# Patient Record
Sex: Male | Born: 1975 | Race: White | Hispanic: No | Marital: Married | Smoking: Never smoker
Health system: Southern US, Community
[De-identification: ages and names within clinical notes are randomized; demographics above are authoritative.]

## PROBLEM LIST (undated history)

## (undated) DIAGNOSIS — R7303 Prediabetes: Secondary | ICD-10-CM

## (undated) DIAGNOSIS — N200 Calculus of kidney: Secondary | ICD-10-CM

## (undated) DIAGNOSIS — K649 Unspecified hemorrhoids: Secondary | ICD-10-CM

## (undated) DIAGNOSIS — K219 Gastro-esophageal reflux disease without esophagitis: Secondary | ICD-10-CM

## (undated) DIAGNOSIS — I10 Essential (primary) hypertension: Secondary | ICD-10-CM

## (undated) DIAGNOSIS — E785 Hyperlipidemia, unspecified: Secondary | ICD-10-CM

## (undated) HISTORY — PX: CHOLECYSTECTOMY: SHX55

---

## 2004-07-13 ENCOUNTER — Emergency Department: Payer: Self-pay | Admitting: General Practice

## 2007-01-08 ENCOUNTER — Inpatient Hospital Stay: Payer: Self-pay | Admitting: Surgery

## 2007-01-08 ENCOUNTER — Other Ambulatory Visit: Payer: Self-pay

## 2008-02-10 ENCOUNTER — Emergency Department: Payer: Self-pay | Admitting: Emergency Medicine

## 2008-08-31 ENCOUNTER — Emergency Department: Payer: Self-pay | Admitting: Emergency Medicine

## 2010-04-17 ENCOUNTER — Emergency Department: Payer: Self-pay | Admitting: Emergency Medicine

## 2010-09-10 DIAGNOSIS — N201 Calculus of ureter: Secondary | ICD-10-CM

## 2010-09-10 HISTORY — DX: Calculus of ureter: N20.1

## 2010-09-10 HISTORY — PX: LITHOTRIPSY: SUR834

## 2011-05-14 ENCOUNTER — Emergency Department: Payer: Self-pay | Admitting: Emergency Medicine

## 2011-06-18 ENCOUNTER — Ambulatory Visit (HOSPITAL_COMMUNITY)
Admission: RE | Admit: 2011-06-18 | Discharge: 2011-06-18 | Disposition: A | Discharge status: Home / Self Care | Payer: BC Managed Care – PPO | Source: Ambulatory Visit | Attending: Urology | Admitting: Urology

## 2011-06-18 DIAGNOSIS — N201 Calculus of ureter: Secondary | ICD-10-CM | POA: Insufficient documentation

## 2012-07-13 ENCOUNTER — Emergency Department: Payer: Self-pay | Admitting: Internal Medicine

## 2012-07-13 LAB — BASIC METABOLIC PANEL
BUN: 13 mg/dL (ref 7–18)
Creatinine: 0.84 mg/dL (ref 0.60–1.30)
EGFR (African American): >60
EGFR (Non-African Amer.): >60
Potassium: 3.8 mmol/L (ref 3.5–5.1)
Sodium: 138 mmol/L (ref 136–145)

## 2012-07-13 LAB — CBC
HGB: 15.6 g/dL (ref 13.0–18.0)
MCH: 31.5 pg (ref 26.0–34.0)
RBC: 4.97 10*6/uL (ref 4.40–5.90)
WBC: 9.9 10*3/uL (ref 3.8–10.6)

## 2013-06-01 ENCOUNTER — Ambulatory Visit: Payer: Self-pay | Admitting: Urology

## 2013-07-24 ENCOUNTER — Ambulatory Visit: Payer: Self-pay | Admitting: Physician Assistant

## 2013-08-11 ENCOUNTER — Emergency Department: Payer: Self-pay | Admitting: Emergency Medicine

## 2013-08-11 LAB — COMPREHENSIVE METABOLIC PANEL
Albumin: 4.2 g/dL (ref 3.4–5.0)
Alkaline Phosphatase: 106 U/L
Anion Gap: 7 (ref 7–16)
BUN: 13 mg/dL (ref 7–18)
Calcium, Total: 9.3 mg/dL (ref 8.5–10.1)
Chloride: 99 mmol/L (ref 98–107)
Co2: 28 mmol/L (ref 21–32)
EGFR (African American): >60
EGFR (Non-African Amer.): >60
Osmolality: 269 (ref 275–301)
SGPT (ALT): 41 U/L (ref 12–78)
Sodium: 134 mmol/L — ABNORMAL LOW (ref 136–145)

## 2013-08-11 LAB — CBC
HCT: 43.4 % (ref 40.0–52.0)
MCHC: 34.2 g/dL (ref 32.0–36.0)
RBC: 4.92 10*6/uL (ref 4.40–5.90)

## 2013-08-11 LAB — URINALYSIS, COMPLETE
Nitrite: NEGATIVE
Ph: 6 (ref 4.5–8.0)
RBC,UR: 5653 /HPF (ref 0–5)
Specific Gravity: 1.024 (ref 1.003–1.030)
WBC UR: 83 /HPF (ref 0–5)

## 2013-10-14 ENCOUNTER — Ambulatory Visit: Payer: Self-pay | Admitting: Urology

## 2014-08-16 IMAGING — CR DG ABDOMEN 1V
1 series · 2 of 2 positions shown · non-contrast
Comparison: CT of 07/24/2013.

CLINICAL DATA: Right flank pain.  Hematuria for 2 days.

EXAM:
ABDOMEN - 1 VIEW

[Series 1: t abdomen supine · 0.14mm/px · 2 of 2 slices shown]
[im 1/2]
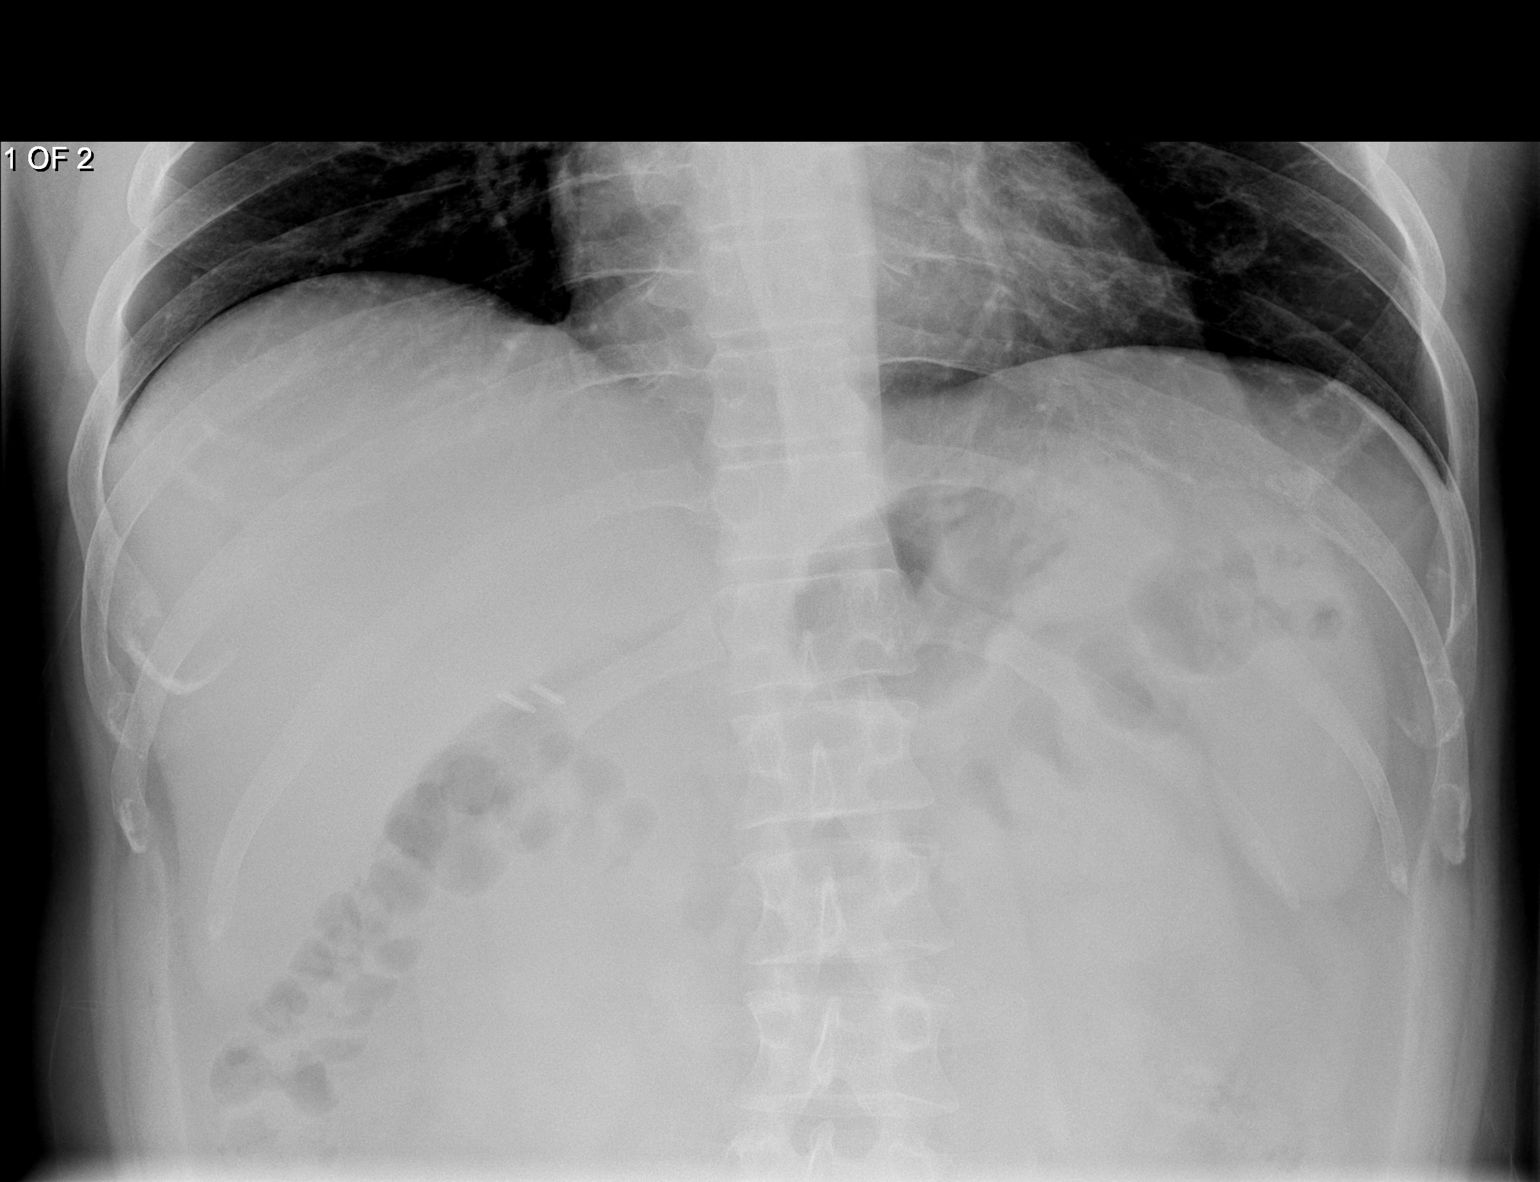
[im 2/2]
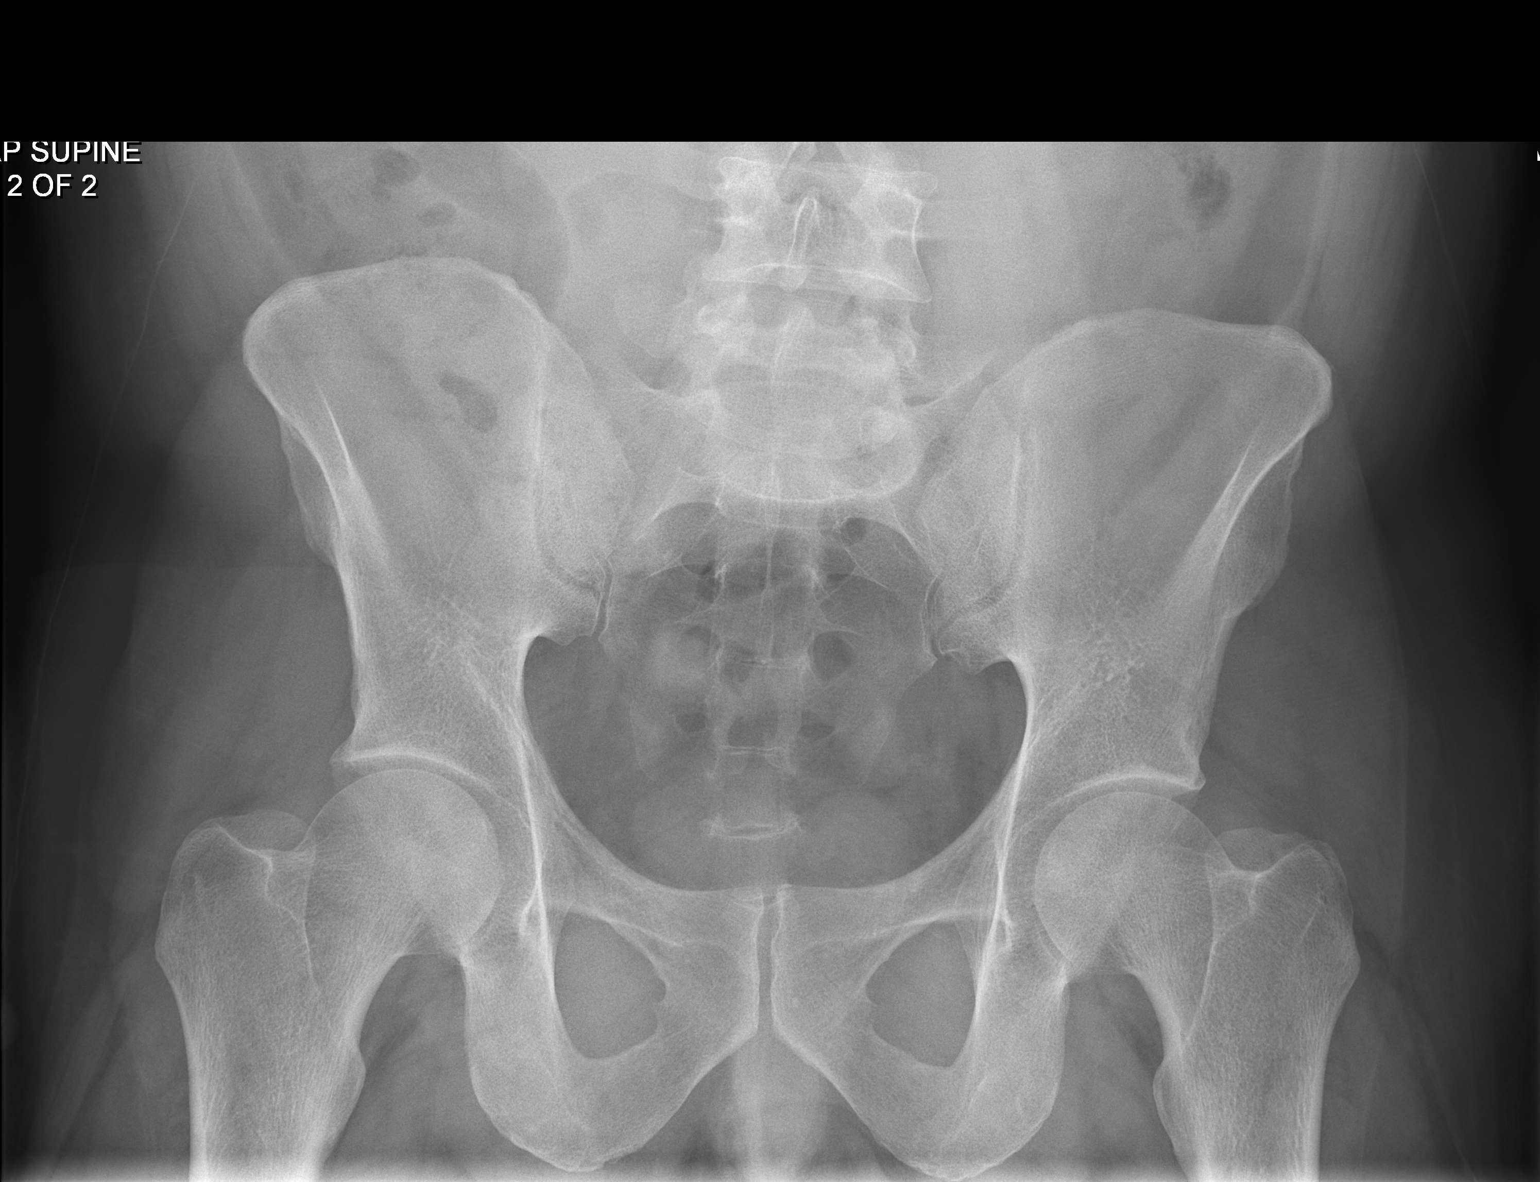

[2 of 2 positions shown; findings below may reference images not displayed]

FINDINGS: Two supine views. Cholecystectomy. The tiny right renal and right
proximal ureteric calculi are not plain film evident. No new stones
are identified. Non-obstructive bowel gas pattern.
IMPRESSION: No plain film evidence of urinary tract calculi.

## 2021-05-15 ENCOUNTER — Emergency Department
Admission: EM | Admit: 2021-05-15 | Discharge: 2021-05-15 | Disposition: A | Discharge status: Home / Self Care | Payer: BC Managed Care – PPO | Attending: Student in an Organized Health Care Education/Training Program | Admitting: Student in an Organized Health Care Education/Training Program

## 2021-05-15 ENCOUNTER — Other Ambulatory Visit: Payer: Self-pay

## 2021-05-15 DIAGNOSIS — A09 Infectious gastroenteritis and colitis, unspecified: Secondary | ICD-10-CM

## 2021-05-15 DIAGNOSIS — Z20822 Contact with and (suspected) exposure to covid-19: Secondary | ICD-10-CM | POA: Diagnosis not present

## 2021-05-15 DIAGNOSIS — R112 Nausea with vomiting, unspecified: Secondary | ICD-10-CM | POA: Diagnosis not present

## 2021-05-15 DIAGNOSIS — R6883 Chills (without fever): Secondary | ICD-10-CM | POA: Diagnosis not present

## 2021-05-15 DIAGNOSIS — R197 Diarrhea, unspecified: Secondary | ICD-10-CM | POA: Insufficient documentation

## 2021-05-15 LAB — C DIFFICILE QUICK SCREEN W PCR REFLEX
C Diff antigen: NEGATIVE
C Diff interpretation: NOT DETECTED
C Diff toxin: NEGATIVE

## 2021-05-15 LAB — GASTROINTESTINAL PANEL BY PCR, STOOL (REPLACES STOOL CULTURE)
Adenovirus F40/41: NOT DETECTED
Astrovirus: NOT DETECTED
Campylobacter species: NOT DETECTED
Cryptosporidium: NOT DETECTED
Cyclospora cayetanensis: NOT DETECTED
Entamoeba histolytica: NOT DETECTED
Enteroaggregative E coli (EAEC): DETECTED — AB
Enteropathogenic E coli (EPEC): DETECTED — AB
Enterotoxigenic E coli (ETEC): NOT DETECTED
Giardia lamblia: NOT DETECTED
Norovirus GI/GII: DETECTED — AB
Plesimonas shigelloides: NOT DETECTED
Rotavirus A: NOT DETECTED
Salmonella species: NOT DETECTED
Sapovirus (I, II, IV, and V): NOT DETECTED
Shiga like toxin producing E coli (STEC): NOT DETECTED
Shigella/Enteroinvasive E coli (EIEC): NOT DETECTED
Vibrio cholerae: NOT DETECTED
Vibrio species: NOT DETECTED
Yersinia enterocolitica: NOT DETECTED

## 2021-05-15 LAB — CBC
HCT: 42 % (ref 39.0–52.0)
Hemoglobin: 15 g/dL (ref 13.0–17.0)
MCH: 31.5 pg (ref 26.0–34.0)
MCHC: 35.7 g/dL (ref 30.0–36.0)
MCV: 88.2 fL (ref 80.0–100.0)
Platelets: 321 10*3/uL (ref 150–400)
RBC: 4.76 MIL/uL (ref 4.22–5.81)
RDW: 12.9 % (ref 11.5–15.5)
WBC: 11.9 10*3/uL — ABNORMAL HIGH (ref 4.0–10.5)
nRBC: 0 % (ref 0.0–0.2)

## 2021-05-15 LAB — COMPREHENSIVE METABOLIC PANEL
ALT: 34 U/L (ref 0–44)
AST: 26 U/L (ref 15–41)
Albumin: 4 g/dL (ref 3.5–5.0)
Alkaline Phosphatase: 84 U/L (ref 38–126)
Anion gap: 8 (ref 5–15)
BUN: 10 mg/dL (ref 6–20)
CO2: 23 mmol/L (ref 22–32)
Calcium: 8.8 mg/dL — ABNORMAL LOW (ref 8.9–10.3)
Chloride: 104 mmol/L (ref 98–111)
Creatinine, Ser: 0.64 mg/dL (ref 0.61–1.24)
GFR, Estimated: >60 mL/min (ref >60)
Glucose, Bld: 159 mg/dL — ABNORMAL HIGH (ref 70–99)
Potassium: 3.4 mmol/L — ABNORMAL LOW (ref 3.5–5.1)
Sodium: 135 mmol/L (ref 135–145)
Total Bilirubin: 0.9 mg/dL (ref 0.3–1.2)
Total Protein: 7.5 g/dL (ref 6.5–8.1)

## 2021-05-15 LAB — URINALYSIS, COMPLETE (UACMP) WITH MICROSCOPIC
Bacteria, UA: NONE SEEN
Bilirubin Urine: NEGATIVE
Glucose, UA: NEGATIVE mg/dL
Hgb urine dipstick: NEGATIVE
Ketones, ur: NEGATIVE mg/dL
Nitrite: NEGATIVE
Specific Gravity, Urine: 1.02 (ref 1.005–1.030)
Squamous Epithelial / HPF: NONE SEEN (ref 0–5)
pH: 5.5 (ref 5.0–8.0)

## 2021-05-15 LAB — RESP PANEL BY RT-PCR (FLU A&B, COVID) ARPGX2
Influenza A by PCR: NEGATIVE
Influenza B by PCR: NEGATIVE
SARS Coronavirus 2 by RT PCR: NEGATIVE

## 2021-05-15 LAB — LIPASE, BLOOD: Lipase: 29 U/L (ref 11–51)

## 2021-05-15 MED ORDER — ONDANSETRON 4 MG PO TBDP
4.0000 mg | ORAL_TABLET | Freq: Once | ORAL | Status: AC
  Administered 2021-05-15: 4 mg via ORAL
  Filled 2021-05-15: qty 1

## 2021-05-15 MED ORDER — ONDANSETRON 4 MG PO TBDP: 4.0000 mg | ORAL_TABLET | Freq: Three times a day (TID) | ORAL | 0 refills | Status: DC | PRN

## 2021-05-15 NOTE — ED Provider Notes (Signed)
[facility] Emergency Department Provider Note    Event Date/Time   First MD Initiated Contact with Patient 05/15/21 1605     (approximate)  I have reviewed the triage vital signs and the nursing notes.   HISTORY  Chief Complaint Emesis    HPI Samuel Villanueva is a 45 y.o. male here with his son for nausea vomiting diarrhea for the past 24 hours after recently returning from the [place]  His wife who traveled with them were sick with similar illness 24 hours prior symptoms resolved.  He denies any blood in his stool.  States it is watery and greenish tinged.  Is having some chills.  They have not been having any cough congestion.  Denies any abdominal pain at this time but was having some crampy discomfort related diarrhea.  History reviewed. No pertinent past medical history. No family history on file. History reviewed. No pertinent surgical history. There are no problems to display for this patient.     Prior to Admission medications   Medication Sig Start Date End Date Taking? Authorizing Provider  ondansetron (ZOFRAN ODT) 4 MG disintegrating tablet Take 1 tablet (4 mg total) by mouth every 8 (eight) hours as needed for nausea or vomiting. 05/15/21  Yes Willy Eddy, MD    Allergies Penicillin g    Social History    Review of Systems Patient denies headaches, rhinorrhea, blurry vision, numbness, shortness of breath, chest pain, edema, cough, abdominal pain, nausea, vomiting, diarrhea, dysuria, fevers, rashes or hallucinations unless otherwise stated above in HPI. ____________________________________________   PHYSICAL EXAM:  VITAL SIGNS: Vitals:   05/15/21 1554 05/15/21 1754  BP: (!) 150/92 (!) 139/93  Pulse: (!) 108 87  Resp: 20 18  Temp: 98.1 F (36.7 C)   SpO2: 96% 99%    Constitutional: Alert and oriented.  Eyes: Conjunctivae are normal.  Head: Atraumatic. Nose: No congestion/rhinnorhea. Mouth/Throat: Mucous  membranes are moist.   Neck: No stridor. Painless ROM.  Cardiovascular: Normal rate, regular rhythm. Grossly normal heart sounds.  Good peripheral circulation. Respiratory: Normal respiratory effort.  No retractions. Lungs CTAB. Gastrointestinal: Soft and nontender. No distention. No abdominal bruits. No CVA tenderness. Genitourinary:  Musculoskeletal: No lower extremity tenderness nor edema.  No joint effusions. Neurologic:  Normal speech and language. No gross focal neurologic deficits are appreciated. No facial droop Skin:  Skin is warm, dry and intact. No rash noted. Psychiatric: Mood and affect are normal. Speech and behavior are normal.  ____________________________________________   LABS (all labs ordered are listed, but only abnormal results are displayed)  Results for orders placed or performed during the hospital encounter of 05/15/21 (from the past 24 hour(s))  Lipase, blood     Status: None   Collection Time: 05/15/21  3:57 PM  Result Value Ref Range   Lipase 29 11 - 51 U/L  Comprehensive metabolic panel     Status: Abnormal   Collection Time: 05/15/21  3:57 PM  Result Value Ref Range   Sodium 135 135 - 145 mmol/L   Potassium 3.4 (L) 3.5 - 5.1 mmol/L   Chloride 104 98 - 111 mmol/L   CO2 23 22 - 32 mmol/L   Glucose, Bld 159 (H) 70 - 99 mg/dL   BUN 10 6 - 20 mg/dL   Creatinine, Ser 1.74 0.61 - 1.24 mg/dL   Calcium 8.8 (L) 8.9 - 10.3 mg/dL   Total Protein 7.5 6.5 - 8.1 g/dL   Albumin 4.0 3.5 - 5.0 g/dL  AST 26 15 - 41 U/L   ALT 34 0 - 44 U/L   Alkaline Phosphatase 84 38 - 126 U/L   Total Bilirubin 0.9 0.3 - 1.2 mg/dL   GFR, Estimated >76 >72 mL/min   Anion gap 8 5 - 15  CBC     Status: Abnormal   Collection Time: 05/15/21  3:57 PM  Result Value Ref Range   WBC 11.9 (H) 4.0 - 10.5 K/uL   RBC 4.76 4.22 - 5.81 MIL/uL   Hemoglobin 15.0 13.0 - 17.0 g/dL   HCT 09.4 70.9 - 62.8 %   MCV 88.2 80.0 - 100.0 fL   MCH 31.5 26.0 - 34.0 pg   MCHC 35.7 30.0 - 36.0 g/dL    RDW 36.6 29.4 - 76.5 %   Platelets 321 150 - 400 K/uL   nRBC 0.0 0.0 - 0.2 %  Urinalysis, Complete w Microscopic     Status: Abnormal   Collection Time: 05/15/21  4:11 PM  Result Value Ref Range   Color, Urine YELLOW YELLOW   APPearance CLEAR CLEAR   Specific Gravity, Urine 1.020 1.005 - 1.030   pH 5.5 5.0 - 8.0   Glucose, UA NEGATIVE NEGATIVE mg/dL   Hgb urine dipstick NEGATIVE NEGATIVE   Bilirubin Urine NEGATIVE NEGATIVE   Ketones, ur NEGATIVE NEGATIVE mg/dL   Protein, ur TRACE (A) NEGATIVE mg/dL   Nitrite NEGATIVE NEGATIVE   Leukocytes,Ua TRACE (A) NEGATIVE   Squamous Epithelial / LPF NONE SEEN 0 - 5   WBC, UA 6-10 0 - 5 WBC/hpf   RBC / HPF 0-5 0 - 5 RBC/hpf   Bacteria, UA NONE SEEN NONE SEEN   Mucus PRESENT   Gastrointestinal Panel by PCR , Stool     Status: Abnormal   Collection Time: 05/15/21  4:24 PM   Specimen: Stool  Result Value Ref Range   Campylobacter species NOT DETECTED NOT DETECTED   Plesimonas shigelloides NOT DETECTED NOT DETECTED   Salmonella species NOT DETECTED NOT DETECTED   Yersinia enterocolitica NOT DETECTED NOT DETECTED   Vibrio species NOT DETECTED NOT DETECTED   Vibrio cholerae NOT DETECTED NOT DETECTED   Enteroaggregative E coli (EAEC) DETECTED (A) NOT DETECTED   Enteropathogenic E coli (EPEC) DETECTED (A) NOT DETECTED   Enterotoxigenic E coli (ETEC) NOT DETECTED NOT DETECTED   Shiga like toxin producing E coli (STEC) NOT DETECTED NOT DETECTED   Shigella/Enteroinvasive E coli (EIEC) NOT DETECTED NOT DETECTED   Cryptosporidium NOT DETECTED NOT DETECTED   Cyclospora cayetanensis NOT DETECTED NOT DETECTED   Entamoeba histolytica NOT DETECTED NOT DETECTED   Giardia lamblia NOT DETECTED NOT DETECTED   Adenovirus F40/41 NOT DETECTED NOT DETECTED   Astrovirus NOT DETECTED NOT DETECTED   Norovirus GI/GII DETECTED (A) NOT DETECTED   Rotavirus A NOT DETECTED NOT DETECTED   Sapovirus (I, II, IV, and V) NOT DETECTED NOT DETECTED  C Difficile Quick Screen  w PCR reflex     Status: None   Collection Time: 05/15/21  4:24 PM   Specimen: Stool  Result Value Ref Range   C Diff antigen NEGATIVE NEGATIVE   C Diff toxin NEGATIVE NEGATIVE   C Diff interpretation No C. difficile detected.   Resp Panel by RT-PCR (Flu A&B, Covid) Nasopharyngeal Swab     Status: None   Collection Time: 05/15/21  4:31 PM   Specimen: Nasopharyngeal Swab; Nasopharyngeal(NP) swabs in vial transport medium  Result Value Ref Range   SARS Coronavirus 2 by RT PCR NEGATIVE  NEGATIVE   Influenza A by PCR NEGATIVE NEGATIVE   Influenza B by PCR NEGATIVE NEGATIVE   ____________________________________________  EKG____________________________________________  RADIOLOGY   ____________________________________________   PROCEDURES  Procedure(s) performed:  Procedures    Critical Care performed: no ____________________________________________   INITIAL IMPRESSION / ASSESSMENT AND PLAN / ED COURSE  Pertinent labs & imaging results that were available during my care of the patient were reviewed by me and considered in my medical decision making (see chart for details).   DDX: enteritis, td, c-dif, fbi, electrolyte abn, covid, dehydration  Samuel Villanueva is a 45 y.o. who presents to the ED with traveler's diarrhea presentation as described above.  He is well nontoxic.  Given Zofran with improvement in symptoms.  His abdominal exam soft and benign.  We will send off stool studies.  Is COVID-negative.  C. difficile negative.  GI PCR positive for norovirus as well as E. coli.  He remains well perfused.  Symptoms improved after Zofran.  Feels comfortable with outpatient follow-up.  We discussed strict return precautions. Patient agreeable to plan.     The patient was evaluated in Emergency Department today for the symptoms described in the history of present illness. He/she was evaluated in the context of the global COVID-19 pandemic, which necessitated consideration that the  patient might be at risk for infection with the SARS-CoV-2 virus that causes COVID-19. Institutional protocols and algorithms that pertain to the evaluation of patients at risk for COVID-19 are in a state of rapid change based on information released by regulatory bodies including the CDC and federal and state organizations. These policies and algorithms were followed during the patient's care in the ED.  As part of my medical decision making, I reviewed the following data within the electronic MEDICAL RECORD NUMBER Nursing notes reviewed and incorporated, Labs reviewed, notes from prior ED visits and [place] Controlled Substance Database   ____________________________________________   FINAL CLINICAL IMPRESSION(S) / ED DIAGNOSES  Final diagnoses:  Traveler's diarrhea      NEW MEDICATIONS STARTED DURING THIS VISIT:  New Prescriptions   ONDANSETRON (ZOFRAN ODT) 4 MG DISINTEGRATING TABLET    Take 1 tablet (4 mg total) by mouth every 8 (eight) hours as needed for nausea or vomiting.     Note:  This document was prepared using Dragon voice recognition software and may include unintentional dictation errors.    Willy Eddy, MD 05/15/21 [phone]

## 2021-05-15 NOTE — ED Triage Notes (Signed)
Pt to ED for diarrhea and emesis that started today. Just returned from [place] Wife and son with same sx. Denies pain

## 2021-05-19 ENCOUNTER — Emergency Department
Admission: EM | Admit: 2021-05-19 | Discharge: 2021-05-19 | Disposition: A | Discharge status: Home / Self Care | Payer: BC Managed Care – PPO | Attending: Emergency Medicine | Admitting: Emergency Medicine

## 2021-05-19 ENCOUNTER — Other Ambulatory Visit: Payer: Self-pay

## 2021-05-19 DIAGNOSIS — K644 Residual hemorrhoidal skin tags: Secondary | ICD-10-CM | POA: Insufficient documentation

## 2021-05-19 DIAGNOSIS — K6289 Other specified diseases of anus and rectum: Secondary | ICD-10-CM | POA: Diagnosis present

## 2021-05-19 LAB — CBC
HCT: 41.2 % (ref 39.0–52.0)
Hemoglobin: 14.1 g/dL (ref 13.0–17.0)
MCH: 30.1 pg (ref 26.0–34.0)
MCHC: 34.2 g/dL (ref 30.0–36.0)
MCV: 88 fL (ref 80.0–100.0)
Platelets: 336 10*3/uL (ref 150–400)
RBC: 4.68 MIL/uL (ref 4.22–5.81)
RDW: 12.9 % (ref 11.5–15.5)
WBC: 10.4 10*3/uL (ref 4.0–10.5)
nRBC: 0 % (ref 0.0–0.2)

## 2021-05-19 MED ORDER — SENNOSIDES-DOCUSATE SODIUM 8.6-50 MG PO TABS: 2.0000 | ORAL_TABLET | Freq: Two times a day (BID) | ORAL | 0 refills | Status: DC

## 2021-05-19 MED ORDER — WITCH HAZEL-GLYCERIN EX PADS
1.0000 | MEDICATED_PAD | CUTANEOUS | 12 refills | Status: DC | PRN
Start: 2021-05-19 — End: 2022-01-16

## 2021-05-19 NOTE — ED Notes (Signed)
ED Provider at bedside. 

## 2021-05-19 NOTE — ED Provider Notes (Signed)
[facility] Emergency Department Provider Note  ____________________________________________  Time seen: Approximately 10:15 PM  I have reviewed the triage vital signs and the nursing notes.   HISTORY  Chief Complaint Rectal Bleeding    HPI Samuel Villanueva is a 45 y.o. male who comes ED complaining of rectal bleeding since this afternoon.  Reports having some rectal bleeding a few days ago, seeking care and being told that he had a hemorrhoid.  He has been trying to put Preparation H on it.  He denies any dizziness chest pain shortness of breath or passing out.  He does not take any blood thinners.  It was constant, no aggravating or alleviating factors.  Not taking any stool softeners.    Past medical history noncontributory   There are no problems to display for this patient.    Past surgical history noncontributory   Prior to Admission medications   Medication Sig Start Date End Date Taking? Authorizing Provider  senna-docusate (SENOKOT-S) 8.6-50 MG tablet Take 2 tablets by mouth 2 (two) times daily. 05/19/21  Yes Sharman Cheek, MD  witch hazel-glycerin (TUCKS) pad Apply 1 application topically as needed for itching. 05/19/21  Yes Sharman Cheek, MD  ondansetron (ZOFRAN ODT) 4 MG disintegrating tablet Take 1 tablet (4 mg total) by mouth every 8 (eight) hours as needed for nausea or vomiting. 05/15/21   Willy Eddy, MD     Allergies Penicillin g   No family history on file.  Social History    Review of Systems  Constitutional:   No fever or chills.  ENT:   No sore throat. No rhinorrhea. Cardiovascular:   No chest pain or syncope. Respiratory:   No dyspnea or cough. Gastrointestinal:   Negative for abdominal pain, vomiting and diarrhea.  Musculoskeletal:   Negative for focal pain or swelling All other systems reviewed and are negative except as documented above in ROS and  HPI.  ____________________________________________   PHYSICAL EXAM:  VITAL SIGNS: ED Triage Vitals  Enc Vitals Group     BP 05/19/21 1920 (!) 136/96     Pulse Rate 05/19/21 1920 94     Resp 05/19/21 1920 16     Temp 05/19/21 1920 98.4 F (36.9 C)     Temp Source 05/19/21 1920 Oral     SpO2 05/19/21 1920 100 %     Weight 05/19/21 1921 220 lb (99.8 kg)     Height 05/19/21 1921 5\' 8"  (1.727 m)     Head Circumference --      Peak Flow --      Pain Score 05/19/21 1921 0     Pain Loc --      Pain Edu? --      Excl. in GC? --     Vital signs reviewed, nursing assessments reviewed.   Constitutional:   Alert and oriented. Non-toxic appearance. Eyes:   Conjunctivae are normal. EOMI. PERRL. ENT      Head:   Normocephalic and atraumatic.      Nose:   Wearing a mask.      Mouth/Throat:   Wearing a mask.      Neck:   No meningismus. Full ROM. Hematological/Lymphatic/Immunilogical:   No cervical lymphadenopathy. Cardiovascular:   RRR.Cap refill less than 2 seconds. Respiratory:   Normal respiratory effort without tachypnea/retractions. Gastrointestinal:   Soft and nontender. Non distended.  No rebound, rigidity, or guarding.  Anal exam shows engorged external hemorrhoid, ulcerated with evidence of recent bleeding.  No current bleeding.  Hemorrhoid surface has thrombosed.  Not significantly tender or inflamed.  Musculoskeletal:   Normal range of motion in all extremities. No joint effusions.  No lower extremity tenderness.  No edema. Neurologic:   Normal speech and language.  Motor grossly intact. No acute focal neurologic deficits are appreciated.  Skin:    Skin is warm, dry and intact. No rash noted.  No petechiae, purpura, or bullae.  ____________________________________________    LABS (pertinent positives/negatives) (all labs ordered are listed, but only abnormal results are displayed) Labs Reviewed  CBC  POC OCCULT BLOOD, ED    ____________________________________________   EKG    ____________________________________________    RADIOLOGY  No results found.  ____________________________________________   PROCEDURES Procedures  ____________________________________________    CLINICAL IMPRESSION / ASSESSMENT AND PLAN / ED COURSE  Medications ordered in the ED: Medications - No data to display  Pertinent labs & imaging results that were available during my care of the patient were reviewed by me and considered in my medical decision making (see chart for details).  Samuel Villanueva was evaluated in Emergency Department on 05/19/2021 for the symptoms described in the history of present illness. He was evaluated in the context of the global COVID-19 pandemic, which necessitated consideration that the patient might be at risk for infection with the SARS-CoV-2 virus that causes COVID-19. Institutional protocols and algorithms that pertain to the evaluation of patients at risk for COVID-19 are in a state of rapid change based on information released by regulatory bodies including the CDC and federal and state organizations. These policies and algorithms were followed during the patient's care in the ED.   Patient presents with a bleeding external hemorrhoid.  Currently bleeding has resolved.  Vital signs, symptoms, hemoglobin are all okay.  He will continue Preparation H and sitz bath's at home.  Also add on Senokot and Tucks which hazel pads.  Recommend he follow-up with general surgery, likely requiring banding or excision.      ____________________________________________   FINAL CLINICAL IMPRESSION(S) / ED DIAGNOSES    Final diagnoses:  External hemorrhoid, bleeding     ED Discharge Orders          Ordered    senna-docusate (SENOKOT-S) 8.6-50 MG tablet  2 times daily        05/19/21 2215    witch hazel-glycerin (TUCKS) pad  As needed        05/19/21 2215            Portions of  this note were generated with dragon dictation software. Dictation errors may occur despite best attempts at proofreading.    Sharman Cheek, MD 05/19/21 2219

## 2021-05-19 NOTE — ED Triage Notes (Signed)
Pt states he was diagnosed with e coli intestinal infection this week. Pt states he now has bright red rectal bleeding. Pt states he was told he has a hemmorrhoid. Pt states has been bleeding since this morning.

## 2021-05-19 NOTE — ED Notes (Addendum)
ED Provider at bedside. 

## 2021-12-27 ENCOUNTER — Ambulatory Visit: Payer: BC Managed Care – PPO | Admitting: Urology

## 2021-12-27 ENCOUNTER — Other Ambulatory Visit
Admission: RE | Admit: 2021-12-27 | Discharge: 2021-12-27 | Disposition: A | Discharge status: Home / Self Care | Payer: BC Managed Care – PPO | Attending: Urology | Admitting: Urology

## 2021-12-27 ENCOUNTER — Encounter: Payer: Self-pay | Admitting: Urology

## 2021-12-27 ENCOUNTER — Other Ambulatory Visit: Payer: Self-pay | Admitting: *Deleted

## 2021-12-27 VITALS — BP 144/92 | HR 74 | Ht 68.0 in | Wt 235.0 lb

## 2021-12-27 DIAGNOSIS — N200 Calculus of kidney: Secondary | ICD-10-CM

## 2021-12-27 DIAGNOSIS — R310 Gross hematuria: Secondary | ICD-10-CM | POA: Insufficient documentation

## 2021-12-27 DIAGNOSIS — Z87442 Personal history of urinary calculi: Secondary | ICD-10-CM | POA: Diagnosis not present

## 2021-12-27 LAB — URINALYSIS, COMPLETE (UACMP) WITH MICROSCOPIC
Bacteria, UA: NONE SEEN
Bilirubin Urine: NEGATIVE
Glucose, UA: NEGATIVE mg/dL
Ketones, ur: NEGATIVE mg/dL
Leukocytes,Ua: NEGATIVE
Nitrite: NEGATIVE
Protein, ur: 100 mg/dL — AB
Specific Gravity, Urine: >1.03 — ABNORMAL HIGH (ref 1.005–1.030)
WBC, UA: NONE SEEN WBC/hpf (ref 0–5)
pH: 7 (ref 5.0–8.0)

## 2021-12-27 NOTE — Patient Instructions (Signed)

## 2021-12-27 NOTE — Progress Notes (Signed)
? ?  12/27/21 ?9:45 AM  ? ?Sandra Cockayne ?Feb 12, 1976 ?[id number] ? ?CC: Gross hematuria, history nephrolithiasis ? ?HPI: ?46 year old healthy male referred for gross hematuria.  He reports 2 to 3 days of dark brown and red urine that was asymptomatic about 2 to 3 weeks ago.  Urinalysis at that time showed many bacteria, greater than 50 RBCs, 0 WBCs negative leukocyte Estrace, nitrite positive, but urine culture was ultimately negative.  He denies any pain or dysuria at that time.  He was treated with Flomax and Cipro, and hematuria resolved in the next few days.  He denies any problems over the last 2 weeks, and is voiding yellow urine spontaneously without issues. ? ?He has a history of nephrolithiasis with 1 spontaneously passed stone, and 1 shockwave lithotripsy. ? ?He denies any smoking history ? ?Urinalysis today is pending ? ?Social History:  reports that he has never smoked. He has never been exposed to tobacco smoke. He has never used smokeless tobacco. He reports that he does not currently use alcohol. He reports that he does not use drugs. ? ?Physical Exam: ?BP (!) 144/92   Pulse 74   Ht 5\' 8"  (1.727 m)   Wt 235 lb (106.6 kg)   BMI 35.73 kg/m?   ? ?Constitutional:  Alert and oriented, No acute distress. ?Cardiovascular: No clubbing, cyanosis, or edema. ?Respiratory: Normal respiratory effort, no increased work of breathing. ?GI: Abdomen is soft, nontender, nondistended, no abdominal masses ? ? ?Laboratory Data: ?Reviewed ? ?Assessment & Plan:   ?46 year old male with 2 to 3 days of dark brown urine and suspected gross hematuria of unclear etiology, urinalysis showed greater than 50 RBCs with many bacteria and nitrite positive, however 0 WBCs and leukocyte negative, and culture showed no growth.  Denies any hematuria or urinary symptoms or flank pain over the last 2 weeks. ? ?We discussed common possible etiologies of hematuria including BPH, malignancy, urolithiasis, medical renal disease, and  idiopathic. Standard workup recommended by the [facility] includes imaging with CT urogram to assess the upper tracts, and cystoscopy. Cytology is performed on patient's with gross hematuria to look for malignant cells in the urine. ? ?CT urogram ordered ?Follow-up urinalysis today, if persistent microscopic hematuria likely pursue cystoscopy as well ? ? ?61, MD ?12/27/2021 ? ?[facility] ?[address] ?[place], [place] ?[phone] ? ? ?

## 2021-12-29 ENCOUNTER — Telehealth: Payer: Self-pay

## 2021-12-29 NOTE — Telephone Encounter (Signed)
-----   Message from Sondra Come, MD sent at 12/28/2021  1:58 PM EDT ----- ?Please schedule cystoscopy for persistent microscopic blood in the urine ? ?Legrand Rams, MD ?12/28/2021 ? ? ?

## 2021-12-29 NOTE — Telephone Encounter (Signed)
Duplicate encounter. See previous. 

## 2021-12-29 NOTE — Telephone Encounter (Signed)
-----   Message from Sondra Come, MD sent at 12/27/2021 10:31 AM EDT ----- ?Regarding: UA results ?Urinalysis today does show microscopic hematuria, so I would recommend cystoscopy as we discussed in clinic.  Please schedule cystoscopy and will review CT results at that visit ? ?Legrand Rams, MD ?12/27/2021 ? ? ? ?

## 2021-12-29 NOTE — Telephone Encounter (Signed)
Called pt no answer. Left detailed message per DPR. Cysto scheduled. CT scan previously ordered.  ?

## 2022-01-09 ENCOUNTER — Ambulatory Visit
Admission: RE | Admit: 2022-01-09 | Discharge: 2022-01-09 | Disposition: A | Discharge status: Home / Self Care | Payer: BC Managed Care – PPO | Source: Ambulatory Visit | Attending: Urology | Admitting: Urology

## 2022-01-09 ENCOUNTER — Ambulatory Visit: Admission: RE | Admit: 2022-01-09 | Payer: BC Managed Care – PPO | Source: Ambulatory Visit

## 2022-01-09 DIAGNOSIS — R310 Gross hematuria: Secondary | ICD-10-CM | POA: Insufficient documentation

## 2022-01-09 MED ORDER — IOHEXOL 350 MG/ML SOLN
100.0000 mL | Freq: Once | INTRAVENOUS | Status: AC | PRN
  Administered 2022-01-09: 100 mL via INTRAVENOUS

## 2022-01-16 ENCOUNTER — Ambulatory Visit: Payer: BC Managed Care – PPO | Admitting: Urology

## 2022-01-16 ENCOUNTER — Other Ambulatory Visit
Admission: RE | Admit: 2022-01-16 | Discharge: 2022-01-16 | Disposition: A | Discharge status: Home / Self Care | Payer: BC Managed Care – PPO | Attending: Urology | Admitting: Urology

## 2022-01-16 ENCOUNTER — Encounter: Payer: Self-pay | Admitting: Urology

## 2022-01-16 ENCOUNTER — Other Ambulatory Visit: Payer: Self-pay | Admitting: *Deleted

## 2022-01-16 VITALS — Ht 68.0 in | Wt 235.0 lb

## 2022-01-16 DIAGNOSIS — N200 Calculus of kidney: Secondary | ICD-10-CM | POA: Diagnosis not present

## 2022-01-16 DIAGNOSIS — R310 Gross hematuria: Secondary | ICD-10-CM | POA: Diagnosis present

## 2022-01-16 DIAGNOSIS — R3121 Asymptomatic microscopic hematuria: Secondary | ICD-10-CM

## 2022-01-16 LAB — URINALYSIS, COMPLETE (UACMP) WITH MICROSCOPIC
Bilirubin Urine: NEGATIVE
Glucose, UA: NEGATIVE mg/dL
Ketones, ur: NEGATIVE mg/dL
Leukocytes,Ua: NEGATIVE
Nitrite: NEGATIVE
Protein, ur: 100 mg/dL — AB
Specific Gravity, Urine: >1.03 — ABNORMAL HIGH (ref 1.005–1.030)
pH: 6.5 (ref 5.0–8.0)

## 2022-01-16 MED ORDER — SULFAMETHOXAZOLE-TRIMETHOPRIM 800-160 MG PO TABS
1.0000 | ORAL_TABLET | Freq: Once | ORAL | Status: AC
  Administered 2022-01-16: 1 via ORAL

## 2022-01-16 NOTE — Progress Notes (Signed)
Cystoscopy Procedure Note: ? ?Indication: Microscopic hematuria ? ?46 year old male who originally presented with a few days of dark brown urine and urinalysis was suspicious for infection, however culture was negative.  This completely resolved with a course of Flomax and Cipro from PCP.  Urinalysis at our clinic visit showed persistent microscopic hematuria and he opted for further evaluation with CT and cystoscopy. ? ?Bactrim given for prophylaxis ? ?After informed consent and discussion of the procedure and its risks, Samuel Villanueva was positioned and prepped in the standard fashion. Cystoscopy was performed with a flexible cystoscope. The urethra, bladder neck and entire bladder was visualized in a standard fashion. The prostate was moderate in size. The ureteral orifices were visualized in their normal location and orientation.  Bladder mucosa grossly normal throughout, mild erythema at the bladder neck on retroflexion but no suspicious lesions. ? ?Imaging: ?CT with 1 cm right lower pole nonobstructive stone, otherwise benign ? ?Findings: ?Normal cystoscopy ? ?------------------------------------------------------------------------------------------- ? ?Assessment and Plan: ?46 year old male with possible gross hematuria that was likely related to prostatitis and resolved with antibiotics and Flomax.  He had persistent microscopic hematuria at follow-up that warranted work-up with CT and cystoscopy which are benign aside from a right-sided 1 cm nonobstructive lower pole stone.  We discussed options including surveillance with KUB, shockwave lithotripsy, or ureteroscopy, laser lithotripsy, and stent placement.  Risks and benefits discussed at length.  He has undergone shockwave before.  He would like to start with surveillance, but understands return precautions of right-sided flank pain, recurrent UTIs, or recurrent gross hematuria. ? ?RTC 6 months KUB prior, sooner if problems ? ?Legrand Rams, MD ?01/16/2022   ? ?

## 2022-07-24 ENCOUNTER — Ambulatory Visit: Payer: BC Managed Care – PPO | Admitting: Urology

## 2022-07-31 ENCOUNTER — Ambulatory Visit: Payer: BC Managed Care – PPO | Admitting: Urology

## 2022-10-15 ENCOUNTER — Encounter: Payer: Self-pay | Admitting: Emergency Medicine

## 2022-10-15 ENCOUNTER — Other Ambulatory Visit: Payer: Self-pay

## 2022-10-15 ENCOUNTER — Emergency Department
Admission: EM | Admit: 2022-10-15 | Discharge: 2022-10-15 | Disposition: A | Discharge status: Home / Self Care | Payer: BC Managed Care – PPO | Attending: Emergency Medicine | Admitting: Emergency Medicine

## 2022-10-15 ENCOUNTER — Emergency Department: Payer: BC Managed Care – PPO

## 2022-10-15 DIAGNOSIS — Z1152 Encounter for screening for COVID-19: Secondary | ICD-10-CM | POA: Diagnosis not present

## 2022-10-15 DIAGNOSIS — J069 Acute upper respiratory infection, unspecified: Secondary | ICD-10-CM | POA: Insufficient documentation

## 2022-10-15 DIAGNOSIS — J101 Influenza due to other identified influenza virus with other respiratory manifestations: Secondary | ICD-10-CM | POA: Diagnosis not present

## 2022-10-15 DIAGNOSIS — R059 Cough, unspecified: Secondary | ICD-10-CM | POA: Diagnosis present

## 2022-10-15 LAB — RESP PANEL BY RT-PCR (RSV, FLU A&B, COVID)  RVPGX2
Influenza A by PCR: POSITIVE — AB
Influenza B by PCR: NEGATIVE
Resp Syncytial Virus by PCR: NEGATIVE
SARS Coronavirus 2 by RT PCR: NEGATIVE

## 2022-10-15 NOTE — Discharge Instructions (Signed)
Your swab is positive for influenza A.  Please follow-up with your outpatient provider.  Please return for any new, worsening, or changing symptoms or other concerns.  You may continue to take Tylenol/ibuprofen per package instructions to help with your symptoms.

## 2022-10-15 NOTE — ED Provider Notes (Signed)
[facility] Provider Note    Event Date/Time   First MD Initiated Contact with Patient 10/15/22 1211     (approximate)   History   URI   HPI  Samuel Villanueva is a 47 y.o. male who presents today for evaluation of cough and runny nose.  Patient reports that his son is sick with the same symptoms and also presents emergency department for evaluation.  Patient reports that he is coughing up a lot of yellow sputum.  He denies fevers or chills.  He has not had any chest pain, shortness of breath, abdominal pain, nausea, vomiting, or diarrhea.  He has not taken anything for his symptoms.  There are no problems to display for this patient.         Physical Exam   Triage Vital Signs: ED Triage Vitals [10/15/22 1214]  Enc Vitals Group     BP      Pulse      Resp      Temp 98.8 F (37.1 C)     Temp Source Oral     SpO2      Weight 235 lb 0.2 oz (106.6 kg)     Height 5\' 8"  (1.727 m)     Head Circumference      Peak Flow      Pain Score 0     Pain Loc      Pain Edu?      Excl. in [place]?     Most recent vital signs: Vitals:   10/15/22 1217 10/15/22 1341  BP: 124/86 120/80  Pulse: 88 80  Resp: 18 18  Temp:    SpO2: 100% 99%    Physical Exam Vitals and nursing note reviewed.  Constitutional:      General: Awake and alert. No acute distress.    Appearance: Normal appearance. The patient is obese.  HENT:     Head: Normocephalic and atraumatic.     Mouth: Mucous membranes are moist.  Nasal congestion present Eyes:     General: PERRL. Normal EOMs        Right eye: No discharge.        Left eye: No discharge.     Conjunctiva/sclera: Conjunctivae normal.  Cardiovascular:     Rate and Rhythm: Normal rate and regular rhythm.     Pulses: Normal pulses.  Pulmonary:     Effort: Pulmonary effort is normal. No respiratory distress.  Able to speak easily in complete sentences    Breath sounds: Normal breath sounds.  Abdominal:     Abdomen is  soft. There is no abdominal tenderness. No rebound or guarding. No distention. Musculoskeletal:        General: No swelling. Normal range of motion.     Cervical back: Normal range of motion and neck supple.  Skin:    General: Skin is warm and dry.     Capillary Refill: Capillary refill takes less than 2 seconds.     Findings: No rash.  Neurological:     Mental Status: The patient is awake and alert.      ED Results / Procedures / Treatments   Labs (all labs ordered are listed, but only abnormal results are displayed) Labs Reviewed  RESP PANEL BY RT-PCR (RSV, FLU A&B, COVID)  RVPGX2 - Abnormal; Notable for the following components:      Result Value   Influenza A by PCR POSITIVE (*)    All other components within normal limits  EKG     RADIOLOGY I independently reviewed and interpreted imaging and agree with radiologists findings.     PROCEDURES:  Critical Care performed:   Procedures   MEDICATIONS ORDERED IN ED: Medications - No data to display   IMPRESSION / MDM / [place] / ED COURSE  I reviewed the triage vital signs and the nursing notes.   Differential diagnosis includes, but is not limited to, COVID, pneumonia, influenza, bronchitis, other URI.  Patient is awake and alert, hemodynamically stable and afebrile.  He has a normal oxygen saturation of 100% on room air.  He is nontoxic in appearance.  His lungs are clear to auscultation bilaterally.  He is quite concerned about his sputum production and would like an x-ray to evaluate for pneumonia.  X-ray was negative for pneumonia.  Swab is positive for influenza A.  Discussed this result with the patient and his family.  Advised that he is highly contagious to others.  Discussed symptomatic management and return precautions. Also discussed option for tamiflu, declined.  Patient understands and agrees with plan.  Discharged in stable condition.   Patient's presentation is most consistent with  acute complicated illness / injury requiring diagnostic workup.      FINAL CLINICAL IMPRESSION(S) / ED DIAGNOSES   Final diagnoses:  Influenza A     Rx / DC Orders   ED Discharge Orders     None        Note:  This document was prepared using Dragon voice recognition software and may include unintentional dictation errors.   Emeline Gins 10/15/22 1345    Harvest Dark, MD 10/15/22 1356

## 2022-10-15 NOTE — ED Triage Notes (Signed)
Presents with cough and body aches  Sx's started last weeks  Afebrile on arrival

## 2022-11-15 ENCOUNTER — Other Ambulatory Visit: Payer: Self-pay | Admitting: Physician Assistant

## 2022-11-15 DIAGNOSIS — R109 Unspecified abdominal pain: Secondary | ICD-10-CM

## 2022-11-19 ENCOUNTER — Telehealth: Payer: Self-pay

## 2022-11-19 ENCOUNTER — Ambulatory Visit
Admission: RE | Admit: 2022-11-19 | Discharge: 2022-11-19 | Disposition: A | Discharge status: Home / Self Care | Payer: BC Managed Care – PPO | Source: Intra-hospital | Attending: Physician Assistant | Admitting: Physician Assistant

## 2022-11-19 ENCOUNTER — Ambulatory Visit: Payer: BC Managed Care – PPO | Admitting: Physician Assistant

## 2022-11-19 ENCOUNTER — Other Ambulatory Visit: Payer: Self-pay | Admitting: Urology

## 2022-11-19 ENCOUNTER — Ambulatory Visit
Admission: RE | Admit: 2022-11-19 | Discharge: 2022-11-19 | Disposition: A | Discharge status: Home / Self Care | Payer: BC Managed Care – PPO | Source: Ambulatory Visit | Attending: Physician Assistant | Admitting: Physician Assistant

## 2022-11-19 VITALS — BP 144/90 | HR 74 | Ht 68.0 in | Wt 232.0 lb

## 2022-11-19 DIAGNOSIS — R109 Unspecified abdominal pain: Secondary | ICD-10-CM | POA: Insufficient documentation

## 2022-11-19 DIAGNOSIS — Z87442 Personal history of urinary calculi: Secondary | ICD-10-CM | POA: Insufficient documentation

## 2022-11-19 DIAGNOSIS — N200 Calculus of kidney: Secondary | ICD-10-CM | POA: Diagnosis not present

## 2022-11-19 LAB — MICROSCOPIC EXAMINATION

## 2022-11-19 LAB — URINALYSIS, COMPLETE
Bilirubin, UA: NEGATIVE
Glucose, UA: NEGATIVE
Ketones, UA: NEGATIVE
Nitrite, UA: NEGATIVE
Specific Gravity, UA: 1.025 (ref 1.005–1.030)
Urobilinogen, Ur: 0.2 mg/dL (ref 0.2–1.0)
pH, UA: 6 (ref 5.0–7.5)

## 2022-11-19 MED ORDER — SULFAMETHOXAZOLE-TRIMETHOPRIM 800-160 MG PO TABS: 1.0000 | ORAL_TABLET | Freq: Two times a day (BID) | ORAL | 0 refills | Status: AC

## 2022-11-19 NOTE — Progress Notes (Signed)
Surgical Physician Order Form [facility]  * Scheduling expectation : Next Available  *Length of Case: 1 hour  *Clearance needed: no  *Anticoagulation Instructions: May continue all anticoagulants  *Aspirin Instructions: Ok to continue Aspirin  *Post-op visit Date/Instructions:   tbd  *Diagnosis: Right nephrolithiasis  *Procedure: right Ureteroscopy w/laser lithotripsy & stent placement [id number])   Additional orders: N/A  -Admit type: OUTpatient  -Anesthesia: General  -VTE Prophylaxis Standing Order SCD's       Other:   -Standing Lab Orders Per Anesthesia    Lab other: UA&Urine Culture sent 3/11  -Standing Test orders EKG/Chest x-ray per Anesthesia       Test other:   - Medications:  Ancef 2gm IV  -Other orders:  N/A

## 2022-11-19 NOTE — Telephone Encounter (Signed)
I spoke with Samuel Villanueva while in office. We have discussed possible surgery dates and Friday March 15th, 2024 was agreed upon by all parties. Patient given information about surgery date, what to expect pre-operatively and post operatively.  We discussed that a Pre-Admission Testing office will be calling to set up the pre-op visit that will take place prior to surgery, and that these appointments are typically done over the phone with a Pre-Admissions RN. Informed patient that our office will communicate any additional care to be provided after surgery. Patients questions or concerns were discussed during our call. Advised to call our office should there be any additional information, questions or concerns that arise. Patient verbalized understanding.

## 2022-11-19 NOTE — Progress Notes (Signed)
   [facility]  Surgery Date: Date: 11/23/2022  Surgeon: Dr. Nickolas Madrid, MD  Inpt ( No  )   Outpt (Yes)   Obs ( No  )   Diagnosis: N20.0 Right Nephrolithiasis  -CPT: [phone]  Surgery: Right Ureteroscopy with Laser Lithotripsy and Stent Placement  Stop Anticoagulations: No, may continue all  Cardiac/Medical/Pulmonary Clearance needed: no  *Orders entered into EPIC  Date: 11/19/22   *Case booked in EPIC  Date: 11/19/22  *Notified pt of Surgery: Date: 11/19/22  PRE-OP UA & CX: yes, obtained while in clinic today 11/19/2022  *Placed into Prior Authorization Work Fabio Bering Date: 11/19/22  Assistant/laser/rep:No

## 2022-11-19 NOTE — Progress Notes (Unsigned)
11/19/2022 1:33 PM   Samuel Villanueva 1976-08-03 [id number]  CC: Chief Complaint  Patient presents with   Nephrolithiasis   Follow-up    HPI: Samuel Villanueva is a 47 y.o. male with PMH gross hematuria with benign workup in 2023 who presents today for ED follow-up of an acute stone episode.  He was seen at [facility] clinic on 10/24/2022 with reports of gross hematuria 2 days prior and right-sided back pain.  UA was notable for microscopic hematuria, no nitrites, pyuria, or bacteriuria.  A KUB was performed, which was suspicious for a 12 mm proximal right ureteral stone at the level of L3.  Today he reports he had persistent right flank pain and vomiting that suddenly resolved 4 days ago.  Today he feels well with no acute concerns.  He denies fever, chills, nausea, vomiting, and dysuria.  He does report some mild RUQ discomfort, but admits he recently had the flu and thinks this may be some rib cage discomfort in the setting of cough.  KUB today shows a large, 15 mm stone apparently in the right renal pelvis.  On review of prior CT, stone measures up to 1100 HU and density.  Skin to stone distance approximately 14 cm.  In-office UA today positive for trace intact blood, 2+ protein, and trace leukocytes; urine microscopy with 11-30 WBCs/HPF, 3-10 RBCs/HPF, and moderate bacteria.   PMH: No past medical history on file.  Surgical History: No past surgical history on file.  Home Medications:  Allergies as of 11/19/2022       Reactions   Chlorpheniramine Maleate Rash   Hydrochlorothiazide Rash   Penicillin G Other (See Comments), Rash        Medication List        Accurate as of November 19, 2022  1:33 PM. If you have any questions, ask your nurse or doctor.          omeprazole 40 MG capsule Commonly known as: PRILOSEC Take 40 mg by mouth daily.        Allergies:  Allergies  Allergen Reactions   Chlorpheniramine Maleate Rash   Hydrochlorothiazide Rash    Penicillin G Other (See Comments) and Rash    Family History: No family history on file.  Social History:   reports that he has never smoked. He has never been exposed to tobacco smoke. He has never used smokeless tobacco. He reports that he does not currently use alcohol. He reports that he does not use drugs.  Physical Exam: BP (!) 144/90   Pulse 74   Ht '5\' 8"'$  (1.727 m)   Wt 232 lb (105.2 kg)   BMI 35.28 kg/m   Constitutional:  Alert and oriented, no acute distress, nontoxic appearing HEENT: [place], AT Cardiovascular: No clubbing, cyanosis, or edema Respiratory: Normal respiratory effort, no increased work of breathing Skin: No rashes, bruises or suspicious lesions Neurologic: Grossly intact, no focal deficits, moving all 4 extremities Psychiatric: Normal mood and affect  Laboratory Data: Results for orders placed or performed in visit on 11/19/22  Microscopic Examination   Urine  Result Value Ref Range   WBC, UA 11-30 (A) 0 - 5 /hpf   RBC, Urine 3-10 (A) 0 - 2 /hpf   Epithelial Cells (non renal) 0-10 0 - 10 /hpf   Mucus, UA Present (A) Not Estab.   Bacteria, UA Moderate (A) None seen/Few  Urinalysis, Complete  Result Value Ref Range   Specific Gravity, UA 1.025 1.005 - 1.030  pH, UA 6.0 5.0 - 7.5   Color, UA Yellow Yellow   Appearance Ur Hazy (A) Clear   Leukocytes,UA Trace (A) Negative   Protein,UA 2+ (A) Negative/Trace   Glucose, UA Negative Negative   Ketones, UA Negative Negative   RBC, UA Trace (A) Negative   Bilirubin, UA Negative Negative   Urobilinogen, Ur 0.2 0.2 - 1.0 mg/dL   Nitrite, UA Negative Negative   Microscopic Examination See below:    Pertinent Imaging: KUB, 11/19/2022: CLINICAL DATA:  Right-sided kidney stone and flank pain   EXAM: ABDOMEN - 1 VIEW   COMPARISON:  Report from abdominal radiograph 10/24/2022   FINDINGS: The bowel gas pattern is normal. 18 mm right renal calculus. No definite ureteral stones.    IMPRESSION: Nonobstructing 18 mm right renal calculus. No definite ureteral stone.     Electronically Signed   By: Placido Sou M.D.   On: 11/20/2022 23:51  I personally reviewed the images referenced above and note a large right renal pelvis stone.  Assessment & Plan:   1. Right renal stone Stone appears to be within the right renal pelvis on KUB today and his symptoms have resolved.  Suspect ball valving stone.  His UA is rather suspicious, however he is asymptomatic of urinary infection at this time.  We discussed various treatment options for his stone including ESWL vs. ureteroscopy with laser lithotripsy and stent. We discussed that his stone is unlikely to spontaneously pass given its size.  We specifically discussed that ESWL is a less invasive procedure that requires less anesthesia, however patients have to pass their residual stone fragments, which may take several weeks and be associated with continued renal colic. Additionally, we discussed the limitations of ESWL including the low, 10-20% chance of treatment failure requiring repeat ESWL versus ureteroscopy in the future. By comparison, ureteroscopy is a more invasive surgical procedure that requires more anesthesia and placement of a ureteral stent, which will remain in place for approximately 3-10 days and can be associated with flank pain, bladder pain, dysuria, urgency, frequency, urinary leakage, and gross hematuria.  Based on this conversation, he would like to proceed with ureteroscopy.  Will send urine for culture and start Bactrim out of an abundance of caution.  We discussed return precautions including fever, chills, nausea, vomiting, and pain. - Urinalysis, Complete - CULTURE, URINE COMPREHENSIVE - sulfamethoxazole-trimethoprim (BACTRIM DS) 800-160 MG tablet; Take 1 tablet by mouth 2 (two) times daily for 5 days.  Dispense: 10 tablet; Refill: 0   Return in 4 days (on 11/23/2022) for Right ureteroscopy with laser  lithotripsy and stent placement with Dr. Diamantina Providence.  Debroah Loop, PA-C  [facility] [address], [place], [place] [address] [phone]

## 2022-11-19 NOTE — H&P (View-Only) (Signed)
 11/19/2022 1:33 PM   Samuel Villanueva 05/31/1976 [id number]  CC: Chief Complaint  Patient presents with   Nephrolithiasis   Follow-up    HPI: Samuel Villanueva is a 47 y.o. male with PMH gross hematuria with benign workup in 2023 who presents today for ED follow-up of an acute stone episode.  He was seen at [facility] clinic on 10/24/2022 with reports of gross hematuria 2 days prior and right-sided back pain.  UA was notable for microscopic hematuria, no nitrites, pyuria, or bacteriuria.  A KUB was performed, which was suspicious for a 12 mm proximal right ureteral stone at the level of L3.  Today he reports he had persistent right flank pain and vomiting that suddenly resolved 4 days ago.  Today he feels well with no acute concerns.  He denies fever, chills, nausea, vomiting, and dysuria.  He does report some mild RUQ discomfort, but admits he recently had the flu and thinks this may be some rib cage discomfort in the setting of cough.  KUB today shows a large, 15 mm stone apparently in the right renal pelvis.  On review of prior CT, stone measures up to 1100 HU and density.  Skin to stone distance approximately 14 cm.  In-office UA today positive for trace intact blood, 2+ protein, and trace leukocytes; urine microscopy with 11-30 WBCs/HPF, 3-10 RBCs/HPF, and moderate bacteria.   PMH: No past medical history on file.  Surgical History: No past surgical history on file.  Home Medications:  Allergies as of 11/19/2022       Reactions   Chlorpheniramine Maleate Rash   Hydrochlorothiazide Rash   Penicillin G Other (See Comments), Rash        Medication List        Accurate as of November 19, 2022  1:33 PM. If you have any questions, ask your nurse or doctor.          omeprazole 40 MG capsule Commonly known as: PRILOSEC Take 40 mg by mouth daily.        Allergies:  Allergies  Allergen Reactions   Chlorpheniramine Maleate Rash   Hydrochlorothiazide Rash    Penicillin G Other (See Comments) and Rash    Family History: No family history on file.  Social History:   reports that he has never smoked. He has never been exposed to tobacco smoke. He has never used smokeless tobacco. He reports that he does not currently use alcohol. He reports that he does not use drugs.  Physical Exam: BP (!) 144/90   Pulse 74   Ht 5' 8" (1.727 m)   Wt 232 lb (105.2 kg)   BMI 35.28 kg/m   Constitutional:  Alert and oriented, no acute distress, nontoxic appearing HEENT: [place], AT Cardiovascular: No clubbing, cyanosis, or edema Respiratory: Normal respiratory effort, no increased work of breathing Skin: No rashes, bruises or suspicious lesions Neurologic: Grossly intact, no focal deficits, moving all 4 extremities Psychiatric: Normal mood and affect  Laboratory Data: Results for orders placed or performed in visit on 11/19/22  Microscopic Examination   Urine  Result Value Ref Range   WBC, UA 11-30 (A) 0 - 5 /hpf   RBC, Urine 3-10 (A) 0 - 2 /hpf   Epithelial Cells (non renal) 0-10 0 - 10 /hpf   Mucus, UA Present (A) Not Estab.   Bacteria, UA Moderate (A) None seen/Few  Urinalysis, Complete  Result Value Ref Range   Specific Gravity, UA 1.025 1.005 - 1.030     pH, UA 6.0 5.0 - 7.5   Color, UA Yellow Yellow   Appearance Ur Hazy (A) Clear   Leukocytes,UA Trace (A) Negative   Protein,UA 2+ (A) Negative/Trace   Glucose, UA Negative Negative   Ketones, UA Negative Negative   RBC, UA Trace (A) Negative   Bilirubin, UA Negative Negative   Urobilinogen, Ur 0.2 0.2 - 1.0 mg/dL   Nitrite, UA Negative Negative   Microscopic Examination See below:    Pertinent Imaging: KUB, 11/19/2022: CLINICAL DATA:  Right-sided kidney stone and flank pain   EXAM: ABDOMEN - 1 VIEW   COMPARISON:  Report from abdominal radiograph 10/24/2022   FINDINGS: The bowel gas pattern is normal. 18 mm right renal calculus. No definite ureteral stones.    IMPRESSION: Nonobstructing 18 mm right renal calculus. No definite ureteral stone.     Electronically Signed   By: Tyler  Stutzman M.D.   On: 11/20/2022 23:51  I personally reviewed the images referenced above and note a large right renal pelvis stone.  Assessment & Plan:   1. Right renal stone Stone appears to be within the right renal pelvis on KUB today and his symptoms have resolved.  Suspect ball valving stone.  His UA is rather suspicious, however he is asymptomatic of urinary infection at this time.  We discussed various treatment options for his stone including ESWL vs. ureteroscopy with laser lithotripsy and stent. We discussed that his stone is unlikely to spontaneously pass given its size.  We specifically discussed that ESWL is a less invasive procedure that requires less anesthesia, however patients have to pass their residual stone fragments, which may take several weeks and be associated with continued renal colic. Additionally, we discussed the limitations of ESWL including the low, 10-20% chance of treatment failure requiring repeat ESWL versus ureteroscopy in the future. By comparison, ureteroscopy is a more invasive surgical procedure that requires more anesthesia and placement of a ureteral stent, which will remain in place for approximately 3-10 days and can be associated with flank pain, bladder pain, dysuria, urgency, frequency, urinary leakage, and gross hematuria.  Based on this conversation, he would like to proceed with ureteroscopy.  Will send urine for culture and start Bactrim out of an abundance of caution.  We discussed return precautions including fever, chills, nausea, vomiting, and pain. - Urinalysis, Complete - CULTURE, URINE COMPREHENSIVE - sulfamethoxazole-trimethoprim (BACTRIM DS) 800-160 MG tablet; Take 1 tablet by mouth 2 (two) times daily for 5 days.  Dispense: 10 tablet; Refill: 0   Return in 4 days (on 11/23/2022) for Right ureteroscopy with laser  lithotripsy and stent placement with Dr. Sninsky.  Kaisyn Reinhold, PA-C  [facility] [address], [address] [place], [place] [address] [phone]    

## 2022-11-19 NOTE — Patient Instructions (Signed)
I'm scheduling you for ureteroscopy surgery with Dr. Diamantina Providence this Friday, 3/15, to treat your right kidney stone. In the meantime, please take the Bactrim antibiotic while we await your preop urine culture to come back.  Please call our office immediately (we are open 8a-5p Monday-Friday) or go to the Emergency Department if you develop any of the following: -Fever/chills -Nausea and/or vomiting -Severe pain

## 2022-11-21 ENCOUNTER — Encounter
Admission: RE | Admit: 2022-11-21 | Discharge: 2022-11-21 | Disposition: A | Discharge status: Home / Self Care | Payer: BC Managed Care – PPO | Source: Ambulatory Visit | Attending: Urology | Admitting: Urology

## 2022-11-21 VITALS — Ht 68.0 in | Wt 232.0 lb

## 2022-11-21 DIAGNOSIS — I10 Essential (primary) hypertension: Secondary | ICD-10-CM

## 2022-11-21 DIAGNOSIS — Z01812 Encounter for preprocedural laboratory examination: Secondary | ICD-10-CM

## 2022-11-21 HISTORY — DX: Prediabetes: R73.03

## 2022-11-21 HISTORY — DX: Unspecified hemorrhoids: K64.9

## 2022-11-21 HISTORY — DX: Essential (primary) hypertension: I10

## 2022-11-21 HISTORY — DX: Calculus of kidney: N20.0

## 2022-11-21 HISTORY — DX: Gastro-esophageal reflux disease without esophagitis: K21.9

## 2022-11-21 HISTORY — DX: Hyperlipidemia, unspecified: E78.5

## 2022-11-21 NOTE — Patient Instructions (Addendum)
Your procedure is scheduled on: Friday, March 15 Report to the Registration Desk on the 1st floor of the [facility]. To find out your arrival time, please call [phone] between 1PM - 3PM on: Thursday, March 14 If your arrival time is 6:00 am, do not arrive before that time as the [facility] entrance doors do not open until 6:00 am.  REMEMBER: Instructions that are not followed completely may result in serious medical risk, up to and including death; or upon the discretion of your surgeon and anesthesiologist your surgery may need to be rescheduled.  Do not eat or drink after midnight the night before surgery.  No gum chewing or hard candies.  One week prior to surgery: starting today, March 13 Stop Anti-inflammatories (NSAIDS) such as Advil, Aleve, Ibuprofen, Motrin, Naproxen, Naprosyn and Aspirin based products such as Excedrin, Goody's Powder, BC Powder. Stop ANY OVER THE COUNTER supplements until after surgery. You may however, continue to take Tylenol if needed for pain up until the day of surgery.  Continue taking all prescribed medications   TAKE ONLY THESE MEDICATIONS THE MORNING OF SURGERY WITH A SIP OF WATER:  Omeprazole (Prilosec) - (take one the night before and one on the morning of surgery - helps to prevent nausea after surgery.) Sulfamethoxazole (Bactrim) if any dosages are left   No Alcohol for 24 hours before or after surgery.  No Smoking including e-cigarettes for 24 hours before surgery.  No chewable tobacco products for at least 6 hours before surgery.  No nicotine patches on the day of surgery.  Do not use any "recreational" drugs for at least a week (preferably 2 weeks) before your surgery.  Please be advised that the combination of cocaine and anesthesia may have negative outcomes, up to and including death. If you test positive for cocaine, your surgery will be cancelled.  On the morning of surgery brush your teeth with toothpaste and water, you may  rinse your mouth with mouthwash if you wish. Do not swallow any toothpaste or mouthwash.  Do not wear jewelry, make-up, hairpins, clips or nail polish.  Do not wear lotions, powders, or perfumes.   Do not shave body hair from the neck down 48 hours before surgery.  Contact lenses, hearing aids and dentures may not be worn into surgery.  Do not bring valuables to the hospital. [facility] is not responsible for any missing/lost belongings or valuables.   Notify your doctor if there is any change in your medical condition (cold, fever, infection).  Wear comfortable clothing (specific to your surgery type) to the hospital.  After surgery, you can help prevent lung complications by doing breathing exercises.  Take deep breaths and cough every 1-2 hours. Your doctor may order a device called an Incentive Spirometer to help you take deep breaths.  If you are being discharged the day of surgery, you will not be allowed to drive home. You will need a responsible individual to drive you home and stay with you for 24 hours after surgery.   If you are taking public transportation, you will need to have a responsible individual with you.  Please call the [facility] Dept. at [phone] if you have any questions about these instructions.  Surgery Visitation Policy:  Patients undergoing a surgery or procedure may have two family members or support persons with them as long as the person is not COVID-19 positive or experiencing its symptoms.

## 2022-11-22 ENCOUNTER — Encounter
Admission: RE | Admit: 2022-11-22 | Discharge: 2022-11-22 | Disposition: A | Discharge status: Home / Self Care | Payer: BC Managed Care – PPO | Source: Ambulatory Visit | Attending: Urology | Admitting: Urology

## 2022-11-22 DIAGNOSIS — Z01818 Encounter for other preprocedural examination: Secondary | ICD-10-CM | POA: Insufficient documentation

## 2022-11-22 DIAGNOSIS — I10 Essential (primary) hypertension: Secondary | ICD-10-CM | POA: Diagnosis not present

## 2022-11-22 DIAGNOSIS — N200 Calculus of kidney: Secondary | ICD-10-CM | POA: Diagnosis not present

## 2022-11-22 DIAGNOSIS — Z01812 Encounter for preprocedural laboratory examination: Secondary | ICD-10-CM

## 2022-11-22 DIAGNOSIS — R310 Gross hematuria: Secondary | ICD-10-CM | POA: Diagnosis not present

## 2022-11-22 LAB — CBC
HCT: 40.8 % (ref 39.0–52.0)
Hemoglobin: 13.7 g/dL (ref 13.0–17.0)
MCH: 30 pg (ref 26.0–34.0)
MCHC: 33.6 g/dL (ref 30.0–36.0)
MCV: 89.5 fL (ref 80.0–100.0)
Platelets: 319 10*3/uL (ref 150–400)
RBC: 4.56 MIL/uL (ref 4.22–5.81)
RDW: 12.9 % (ref 11.5–15.5)
WBC: 5.8 10*3/uL (ref 4.0–10.5)
nRBC: 0 % (ref 0.0–0.2)

## 2022-11-22 LAB — BASIC METABOLIC PANEL WITH GFR
Anion gap: 10 (ref 5–15)
BUN: 10 mg/dL (ref 6–20)
CO2: 25 mmol/L (ref 22–32)
Calcium: 8.8 mg/dL — ABNORMAL LOW (ref 8.9–10.3)
Chloride: 101 mmol/L (ref 98–111)
Creatinine, Ser: 0.94 mg/dL (ref 0.61–1.24)
GFR, Estimated: >60 mL/min
Glucose, Bld: 147 mg/dL — ABNORMAL HIGH (ref 70–99)
Potassium: 3.3 mmol/L — ABNORMAL LOW (ref 3.5–5.1)
Sodium: 136 mmol/L (ref 135–145)

## 2022-11-22 LAB — CULTURE, URINE COMPREHENSIVE

## 2022-11-22 MED ORDER — ORAL CARE MOUTH RINSE: 15.0000 mL | Freq: Once | OROMUCOSAL | Status: AC

## 2022-11-22 MED ORDER — CHLORHEXIDINE GLUCONATE 0.12 % MT SOLN: 15.0000 mL | Freq: Once | OROMUCOSAL | Status: AC

## 2022-11-22 MED ORDER — CEFAZOLIN SODIUM-DEXTROSE 2-4 GM/100ML-% IV SOLN
2.0000 g | INTRAVENOUS | Status: AC
  Administered 2022-11-23: 2 g via INTRAVENOUS

## 2022-11-22 MED ORDER — LACTATED RINGERS IV SOLN: INTRAVENOUS | Status: DC

## 2022-11-23 ENCOUNTER — Encounter
Admission: RE | Disposition: A | Discharge status: Home / Self Care | Payer: Self-pay | Source: Ambulatory Visit | Attending: Urology

## 2022-11-23 ENCOUNTER — Encounter: Payer: Self-pay | Admitting: Urology

## 2022-11-23 ENCOUNTER — Ambulatory Visit: Payer: BC Managed Care – PPO | Admitting: Urgent Care

## 2022-11-23 ENCOUNTER — Ambulatory Visit: Payer: BC Managed Care – PPO

## 2022-11-23 ENCOUNTER — Other Ambulatory Visit: Payer: Self-pay

## 2022-11-23 ENCOUNTER — Telehealth: Payer: Self-pay

## 2022-11-23 ENCOUNTER — Ambulatory Visit: Payer: BC Managed Care – PPO | Admitting: Anesthesiology

## 2022-11-23 ENCOUNTER — Ambulatory Visit
Admission: RE | Admit: 2022-11-23 | Discharge: 2022-11-23 | Disposition: A | Discharge status: Home / Self Care | Payer: BC Managed Care – PPO | Source: Ambulatory Visit | Attending: Urology | Admitting: Urology

## 2022-11-23 DIAGNOSIS — R310 Gross hematuria: Secondary | ICD-10-CM | POA: Insufficient documentation

## 2022-11-23 DIAGNOSIS — N200 Calculus of kidney: Secondary | ICD-10-CM | POA: Insufficient documentation

## 2022-11-23 DIAGNOSIS — I10 Essential (primary) hypertension: Secondary | ICD-10-CM | POA: Insufficient documentation

## 2022-11-23 HISTORY — PX: CYSTOSCOPY/URETEROSCOPY/HOLMIUM LASER/STENT PLACEMENT: SHX6546

## 2022-11-23 SURGERY — CYSTOSCOPY/URETEROSCOPY/HOLMIUM LASER/STENT PLACEMENT
Anesthesia: General | Laterality: Right

## 2022-11-23 MED ORDER — DEXMEDETOMIDINE HCL IN NACL 80 MCG/20ML IV SOLN
INTRAVENOUS | Status: DC | PRN
  Administered 2022-11-23: 12 ug via BUCCAL

## 2022-11-23 MED ORDER — CHLORHEXIDINE GLUCONATE 0.12 % MT SOLN
OROMUCOSAL | Status: AC
  Administered 2022-11-23: 15 mL via OROMUCOSAL
  Filled 2022-11-23: qty 15

## 2022-11-23 MED ORDER — ROCURONIUM BROMIDE 100 MG/10ML IV SOLN
INTRAVENOUS | Status: DC | PRN
  Administered 2022-11-23: 60 mg via INTRAVENOUS

## 2022-11-23 MED ORDER — ACETAMINOPHEN 10 MG/ML IV SOLN
INTRAVENOUS | Status: AC
  Filled 2022-11-23: qty 100

## 2022-11-23 MED ORDER — ROCURONIUM BROMIDE 10 MG/ML (PF) SYRINGE
PREFILLED_SYRINGE | INTRAVENOUS | Status: AC
  Filled 2022-11-23: qty 10

## 2022-11-23 MED ORDER — OXYCODONE HCL 5 MG/5ML PO SOLN: 5.0000 mg | Freq: Once | ORAL | Status: AC | PRN

## 2022-11-23 MED ORDER — PROPOFOL 10 MG/ML IV BOLUS
INTRAVENOUS | Status: AC
  Filled 2022-11-23: qty 20

## 2022-11-23 MED ORDER — ONDANSETRON HCL 4 MG/2ML IJ SOLN
INTRAMUSCULAR | Status: AC
  Filled 2022-11-23: qty 2

## 2022-11-23 MED ORDER — KETOROLAC TROMETHAMINE 30 MG/ML IJ SOLN
INTRAMUSCULAR | Status: AC
  Filled 2022-11-23: qty 1

## 2022-11-23 MED ORDER — SUGAMMADEX SODIUM 200 MG/2ML IV SOLN
INTRAVENOUS | Status: DC | PRN
  Administered 2022-11-23 (×2): 200 mg via INTRAVENOUS

## 2022-11-23 MED ORDER — SODIUM CHLORIDE 0.9 % IR SOLN
Status: DC | PRN
  Administered 2022-11-23: 1000 mL via INTRAVESICAL

## 2022-11-23 MED ORDER — MIDAZOLAM HCL 2 MG/2ML IJ SOLN
INTRAMUSCULAR | Status: DC | PRN
  Administered 2022-11-23: 2 mg via INTRAVENOUS

## 2022-11-23 MED ORDER — OXYCODONE HCL 5 MG PO TABS
5.0000 mg | ORAL_TABLET | Freq: Once | ORAL | Status: AC | PRN
  Administered 2022-11-23: 5 mg via ORAL

## 2022-11-23 MED ORDER — FENTANYL CITRATE (PF) 100 MCG/2ML IJ SOLN
INTRAMUSCULAR | Status: DC | PRN
  Administered 2022-11-23: 100 ug via INTRAVENOUS
  Administered 2022-11-23: 50 ug via INTRAVENOUS

## 2022-11-23 MED ORDER — DEXAMETHASONE SODIUM PHOSPHATE 10 MG/ML IJ SOLN
INTRAMUSCULAR | Status: DC | PRN
  Administered 2022-11-23: 5 mg via INTRAVENOUS

## 2022-11-23 MED ORDER — LIDOCAINE HCL (PF) 2 % IJ SOLN
INTRAMUSCULAR | Status: AC
  Filled 2022-11-23: qty 5

## 2022-11-23 MED ORDER — HYDROCODONE-ACETAMINOPHEN 5-325 MG PO TABS: 1.0000 | ORAL_TABLET | Freq: Four times a day (QID) | ORAL | 0 refills | Status: AC | PRN

## 2022-11-23 MED ORDER — LIDOCAINE HCL (CARDIAC) PF 100 MG/5ML IV SOSY
PREFILLED_SYRINGE | INTRAVENOUS | Status: DC | PRN
  Administered 2022-11-23: 80 mg via INTRAVENOUS

## 2022-11-23 MED ORDER — DEXAMETHASONE SODIUM PHOSPHATE 10 MG/ML IJ SOLN
INTRAMUSCULAR | Status: AC
  Filled 2022-11-23: qty 1

## 2022-11-23 MED ORDER — CEFAZOLIN SODIUM-DEXTROSE 2-4 GM/100ML-% IV SOLN
INTRAVENOUS | Status: AC
  Filled 2022-11-23: qty 100

## 2022-11-23 MED ORDER — MIDAZOLAM HCL 2 MG/2ML IJ SOLN
INTRAMUSCULAR | Status: AC
  Filled 2022-11-23: qty 2

## 2022-11-23 MED ORDER — IOHEXOL 180 MG/ML  SOLN
INTRAMUSCULAR | Status: DC | PRN
  Administered 2022-11-23: 10 mL

## 2022-11-23 MED ORDER — PROPOFOL 10 MG/ML IV BOLUS
INTRAVENOUS | Status: DC | PRN
  Administered 2022-11-23: 200 mg via INTRAVENOUS

## 2022-11-23 MED ORDER — ACETAMINOPHEN 10 MG/ML IV SOLN
INTRAVENOUS | Status: DC | PRN
  Administered 2022-11-23: 1000 mg via INTRAVENOUS

## 2022-11-23 MED ORDER — ONDANSETRON HCL 4 MG/2ML IJ SOLN
INTRAMUSCULAR | Status: DC | PRN
  Administered 2022-11-23: 4 mg via INTRAVENOUS

## 2022-11-23 MED ORDER — FENTANYL CITRATE (PF) 100 MCG/2ML IJ SOLN
INTRAMUSCULAR | Status: AC
  Filled 2022-11-23: qty 2

## 2022-11-23 MED ORDER — FENTANYL CITRATE (PF) 100 MCG/2ML IJ SOLN: 25.0000 ug | INTRAMUSCULAR | Status: DC | PRN

## 2022-11-23 MED ORDER — KETOROLAC TROMETHAMINE 30 MG/ML IJ SOLN
INTRAMUSCULAR | Status: DC | PRN
  Administered 2022-11-23: 30 mg via INTRAVENOUS

## 2022-11-23 MED ORDER — TAMSULOSIN HCL 0.4 MG PO CAPS: 0.4000 mg | ORAL_CAPSULE | Freq: Every day | ORAL | 0 refills | Status: AC

## 2022-11-23 MED ORDER — OXYCODONE HCL 5 MG PO TABS
ORAL_TABLET | ORAL | Status: AC
  Filled 2022-11-23: qty 1

## 2022-11-23 SURGICAL SUPPLY — 31 items
ADH LQ OCL WTPRF AMP STRL LF (MISCELLANEOUS)
ADHESIVE MASTISOL STRL (MISCELLANEOUS) IMPLANT
BAG DRAIN SIEMENS DORNER NS (MISCELLANEOUS) ×1 IMPLANT
BAG DRN NS LF (MISCELLANEOUS) ×1
BAG PRESSURE INF REUSE 3000 (BAG) ×1 IMPLANT
BRUSH SCRUB EZ 1% IODOPHOR (MISCELLANEOUS) ×1 IMPLANT
CATH URET FLEX-TIP 2 LUMEN 10F (CATHETERS) IMPLANT
CATH URETL OPEN 5X70 (CATHETERS) IMPLANT
CNTNR URN SCR LID CUP LEK RST (MISCELLANEOUS) IMPLANT
CONT SPEC 4OZ STRL OR WHT (MISCELLANEOUS)
DRAPE UTILITY 15X26 TOWEL STRL (DRAPES) ×1 IMPLANT
DRSG TEGADERM 2-3/8X2-3/4 SM (GAUZE/BANDAGES/DRESSINGS) IMPLANT
FIBER LASER MOSES 200 DFL (Laser) IMPLANT
GLOVE SURG UNDER POLY LF SZ7.5 (GLOVE) ×1 IMPLANT
GOWN STRL REUS W/ TWL LRG LVL3 (GOWN DISPOSABLE) ×1 IMPLANT
GOWN STRL REUS W/ TWL XL LVL3 (GOWN DISPOSABLE) ×1 IMPLANT
GOWN STRL REUS W/TWL LRG LVL3 (GOWN DISPOSABLE) ×1
GOWN STRL REUS W/TWL XL LVL3 (GOWN DISPOSABLE) ×1
GUIDEWIRE STR DUAL SENSOR (WIRE) ×1 IMPLANT
IV NS IRRIG 3000ML ARTHROMATIC (IV SOLUTION) ×1 IMPLANT
KIT TURNOVER CYSTO (KITS) ×1 IMPLANT
PACK CYSTO AR (MISCELLANEOUS) ×1 IMPLANT
SET CYSTO W/LG BORE CLAMP LF (SET/KITS/TRAYS/PACK) ×1 IMPLANT
SHEATH NAVIGATOR HD 12/14X36 (SHEATH) IMPLANT
STENT URET 6FRX24 CONTOUR (STENTS) IMPLANT
STENT URET 6FRX26 CONTOUR (STENTS) IMPLANT
SURGILUBE 2OZ TUBE FLIPTOP (MISCELLANEOUS) ×1 IMPLANT
SYR 10ML LL (SYRINGE) ×1 IMPLANT
TRAP FLUID SMOKE EVACUATOR (MISCELLANEOUS) ×1 IMPLANT
VALVE UROSEAL ADJ ENDO (VALVE) IMPLANT
WATER STERILE IRR 500ML POUR (IV SOLUTION) ×1 IMPLANT

## 2022-11-23 NOTE — Interval H&P Note (Signed)
UROLOGY H&P UPDATE  Agree with prior H&P dated 11/19/2022 by Debroah Loop.  15 mm right renal stone causing intermittent obstruction and renal colic  Cardiac: RRR Lungs: CTA bilaterally  Laterality: Right Procedure: Cystoscopy, right ureteroscopy, laser lithotripsy, stent placement  Urine: Culture 3/11 no growth  We specifically discussed the risks ureteroscopy including bleeding, infection/sepsis, stent related symptoms including flank pain/urgency/frequency/incontinence/dysuria, ureteral injury, inability to access stone, or need for staged or additional procedures.   Samuel Co, MD 11/23/2022

## 2022-11-23 NOTE — Anesthesia Procedure Notes (Signed)
Procedure Name: Intubation Date/Time: 11/23/2022 11:58 AM  Performed by: Levin Erp, CRNAPre-anesthesia Checklist: Patient identified, Patient being monitored, Timeout performed, Emergency Drugs available and Suction available Patient Re-evaluated:Patient Re-evaluated prior to induction Oxygen Delivery Method: Circle system utilized Preoxygenation: Pre-oxygenation with 100% oxygen Induction Type: IV induction Ventilation: Mask ventilation without difficulty Laryngoscope Size: McGraph and 4 Grade View: Grade I Tube type: Oral Tube size: 7.5 mm Number of attempts: 1 Airway Equipment and Method: Stylet Placement Confirmation: ETT inserted through vocal cords under direct vision, positive ETCO2 and breath sounds checked- equal and bilateral Secured at: 22 cm Tube secured with: Tape Dental Injury: Teeth and Oropharynx as per pre-operative assessment

## 2022-11-23 NOTE — Transfer of Care (Signed)
Immediate Anesthesia Transfer of Care Note  Patient: Samuel Villanueva  Procedure(s) Performed: CYSTOSCOPY/URETEROSCOPY/HOLMIUM LASER/STENT PLACEMENT (Right)  Patient Location: PACU  Anesthesia Type:General  Level of Consciousness: awake and responds to stimulation  Airway & Oxygen Therapy: Patient Spontanous Breathing and Patient connected to face mask oxygen  Post-op Assessment: Report given to RN and Post -op Vital signs reviewed and stable  Post vital signs: Reviewed and stable  Last Vitals:  Vitals Value Taken Time  BP 148/89 11/23/22 1245  Temp    Pulse 81 11/23/22 1247  Resp 18 11/23/22 1247  SpO2 100 % 11/23/22 1247  Vitals shown include unvalidated device data.  Last Pain:  Vitals:   11/23/22 1107  TempSrc: Temporal  PainSc: 0-No pain         Complications: No notable events documented.

## 2022-11-23 NOTE — Telephone Encounter (Signed)
Patient called stating he had surgery today but was not told when he should go back to work. I spoke with Dr Diamantina Providence and advised patient he can return to work on Monday 11/26/22 with no restrictions. Patient understood.

## 2022-11-23 NOTE — Anesthesia Postprocedure Evaluation (Signed)
Anesthesia Post Note  Patient: Samuel Villanueva  Procedure(s) Performed: CYSTOSCOPY/URETEROSCOPY/HOLMIUM LASER/STENT PLACEMENT (Right)  Patient location during evaluation: PACU Anesthesia Type: General Level of consciousness: awake and alert Pain management: pain level controlled Vital Signs Assessment: post-procedure vital signs reviewed and stable Respiratory status: spontaneous breathing, nonlabored ventilation, respiratory function stable and patient connected to nasal cannula oxygen Cardiovascular status: blood pressure returned to baseline and stable Postop Assessment: no apparent nausea or vomiting Anesthetic complications: no  No notable events documented.   Last Vitals:  Vitals:   11/23/22 1305 11/23/22 1330  BP:  (!) 128/90  Pulse: 82 75  Resp: 18 15  Temp:  (!) 36.3 C  SpO2: 97% 95%    Last Pain:  Vitals:   11/23/22 1330  TempSrc:   PainSc: Rufus

## 2022-11-23 NOTE — Discharge Instructions (Signed)

## 2022-11-23 NOTE — Anesthesia Preprocedure Evaluation (Signed)
Anesthesia Evaluation  Patient identified by MRN, date of birth, ID band Patient awake    Reviewed: Allergy & Precautions, NPO status , Patient's Chart, lab work & pertinent test results  History of Anesthesia Complications Negative for: history of anesthetic complications  Airway Mallampati: III  TM Distance: >3 FB Neck ROM: full    Dental  (+) Chipped, Dental Advidsory Given   Pulmonary neg pulmonary ROS, neg sleep apnea, neg COPD   Pulmonary exam normal        Cardiovascular hypertension, On Medications (-) angina (-) CAD, (-) Past MI and (-) CABG negative cardio ROS Normal cardiovascular exam     Neuro/Psych negative neurological ROS  negative psych ROS   GI/Hepatic negative GI ROS, Neg liver ROS,,,  Endo/Other  negative endocrine ROS    Renal/GU      Musculoskeletal   Abdominal   Peds  Hematology negative hematology ROS (+)   Anesthesia Other Findings Past Medical History: No date: Essential hypertension No date: GERD (gastroesophageal reflux disease) No date: Hemorrhoids No date: Hyperlipidemia 2012: Left ureteral stone No date: Pre-diabetes No date: Right nephrolithiasis  Past Surgical History: No date: CHOLECYSTECTOMY 2012: LITHOTRIPSY  BMI    Body Mass Index: 35.28 kg/m      Reproductive/Obstetrics negative OB ROS                             Anesthesia Physical Anesthesia Plan  ASA: 2  Anesthesia Plan: General ETT   Post-op Pain Management:    Induction: Intravenous  PONV Risk Score and Plan: Ondansetron, Dexamethasone, Midazolam and Treatment may vary due to age or medical condition  Airway Management Planned: Oral ETT  Additional Equipment:   Intra-op Plan:   Post-operative Plan: Extubation in OR  Informed Consent: I have reviewed the patients History and Physical, chart, labs and discussed the procedure including the risks, benefits and  alternatives for the proposed anesthesia with the patient or authorized representative who has indicated his/her understanding and acceptance.     Dental Advisory Given  Plan Discussed with: Anesthesiologist, CRNA and Surgeon  Anesthesia Plan Comments: (Patient consented for risks of anesthesia including but not limited to:  - adverse reactions to medications - damage to eyes, teeth, lips or other oral mucosa - nerve damage due to positioning  - sore throat or hoarseness - Damage to heart, brain, nerves, lungs, other parts of body or loss of life  Patient voiced understanding.)       Anesthesia Quick Evaluation

## 2022-11-23 NOTE — Op Note (Signed)
Date of procedure: 11/23/22  Preoperative diagnosis:  Right renal stone  Postoperative diagnosis:  Same  Procedure: Cystoscopy, right ureteroscopy, laser lithotripsy, right retrograde pyelogram with intraoperative interpretation, right ureteral stent placement  Surgeon: Nickolas Madrid, MD  Anesthesia: General  Complications: None  Intraoperative findings:  Normal bladder, uncomplicated dusting of 1.5 cm right lower pole stone with stent placement  EBL: Minimal  Specimens: None  Drains: Right 6 French by 26 cm ureteral stent  Indication: Samuel Villanueva is a 47 y.o. patient with intermittent flank pain felt to be related to ball valving of a 1.5 cm right renal pelvis stone and he opted for ureteroscopy.  After reviewing the management options for treatment, they elected to proceed with the above surgical procedure(s). We have discussed the potential benefits and risks of the procedure, side effects of the proposed treatment, the likelihood of the patient achieving the goals of the procedure, and any potential problems that might occur during the procedure or recuperation. Informed consent has been obtained.  Description of procedure:  The patient was taken to the operating room and general anesthesia was induced. SCDs were placed for DVT prophylaxis. The patient was placed in the dorsal lithotomy position, prepped and draped in the usual sterile fashion, and preoperative antibiotics(Ancef) were administered. A preoperative time-out was performed.   A 21 French rigid cystoscope was used to intubate the urethra and thorough cystoscopy was performed.  The bladder was grossly normal, and the ureteral orifices orthotopic bilaterally.  Prostate was small.  A sensor wire was advanced into the right ureteral orifice and passed easily up to the kidney under fluoroscopic vision.  A single channel digital flexible ureteroscope advanced easily over the wire up to the kidney.  Thorough pyeloscopy  was performed and there was a 1.5 cm yellow stone in the right lower pole.  A 200 m laser fiber on settings of 0.5 J and 80 Hz was used to methodically dust the stone to fragments smaller than the laser fiber.  Thorough pyeloscopy revealed no other significant stone fragments.  A retrograde pyelogram was performed from the proximal ureter which showed no extravasation or filling defects.  Careful pullback ureteroscopy showed no ureteral injury or residual stones.  The rigid cystoscope was reinserted into the bladder and a sensor wire advanced easily into the right kidney.  A 6 French by 26 cm stent was uneventfully placed with a curl in the renal pelvis, as well as in the bladder under direct vision.  The bladder was drained and this concluded our procedure.  Disposition: Stable to PACU  Plan: Schedule stent removal in clinic in ~1 week  Nickolas Madrid, MD

## 2022-11-24 ENCOUNTER — Encounter: Payer: Self-pay | Admitting: Urology

## 2022-12-05 ENCOUNTER — Ambulatory Visit (INDEPENDENT_AMBULATORY_CARE_PROVIDER_SITE_OTHER): Payer: BC Managed Care – PPO | Admitting: Urology

## 2022-12-05 ENCOUNTER — Encounter: Payer: Self-pay | Admitting: Urology

## 2022-12-05 VITALS — Ht 68.0 in | Wt 226.0 lb

## 2022-12-05 DIAGNOSIS — Z466 Encounter for fitting and adjustment of urinary device: Secondary | ICD-10-CM | POA: Diagnosis not present

## 2022-12-05 DIAGNOSIS — Z87442 Personal history of urinary calculi: Secondary | ICD-10-CM | POA: Diagnosis not present

## 2022-12-05 LAB — URINALYSIS, COMPLETE
Bilirubin, UA: NEGATIVE
Glucose, UA: NEGATIVE
Nitrite, UA: POSITIVE — AB
Specific Gravity, UA: 1.025 (ref 1.005–1.030)
Urobilinogen, Ur: 1 mg/dL (ref 0.2–1.0)
pH, UA: 6 (ref 5.0–7.5)

## 2022-12-05 LAB — MICROSCOPIC EXAMINATION: RBC, Urine: >30 /hpf — AB (ref 0–2)

## 2022-12-05 MED ORDER — SULFAMETHOXAZOLE-TRIMETHOPRIM 800-160 MG PO TABS
1.0000 | ORAL_TABLET | Freq: Once | ORAL | Status: AC
  Administered 2022-12-05: 1 via ORAL

## 2022-12-05 NOTE — Addendum Note (Signed)
Addended by: Despina Hidden on: 12/05/2022 01:56 PM   Modules accepted: Orders

## 2022-12-05 NOTE — Addendum Note (Signed)
Addended by: Despina Hidden on: 12/05/2022 03:11 PM   Modules accepted: Orders

## 2022-12-05 NOTE — Progress Notes (Signed)
Cystoscopy Procedure Note:  Indication: Stent removal s/p 11/23/2022 right ureteroscopy for 1.5 cm renal stone  Bactrim given for prophylaxis  After informed consent and discussion of the procedure and its risks, Samuel Villanueva was positioned and prepped in the standard fashion. Cystoscopy was performed with a flexible cystoscope. The stent was grasped with flexible graspers and removed in its entirety. The patient tolerated the procedure well.  Findings: Uncomplicated stent removal  Assessment and Plan: Stone prevention strategies discussed, RTC 1 year KUB prior   Billey Co, MD 12/05/2022

## 2022-12-08 LAB — CULTURE, URINE COMPREHENSIVE

## 2023-08-27 ENCOUNTER — Encounter: Payer: Self-pay | Admitting: Urology

## 2023-12-04 ENCOUNTER — Ambulatory Visit: Payer: BC Managed Care – PPO | Admitting: Urology

## 2023-12-05 ENCOUNTER — Ambulatory Visit: Payer: Self-pay | Admitting: Urology

## 2024-01-08 ENCOUNTER — Ambulatory Visit: Payer: Self-pay

## 2024-01-08 DIAGNOSIS — Z800 Family history of malignant neoplasm of digestive organs: Secondary | ICD-10-CM | POA: Diagnosis not present

## 2024-01-08 DIAGNOSIS — Z1211 Encounter for screening for malignant neoplasm of colon: Secondary | ICD-10-CM | POA: Diagnosis present
